# Patient Record
Sex: Male | Born: 1955 | Race: White | Hispanic: No | Marital: Married | Smoking: Never smoker
Health system: Southern US, Community
[De-identification: ages and names within clinical notes are randomized; demographics above are authoritative.]

## PROBLEM LIST (undated history)

## (undated) DIAGNOSIS — I10 Essential (primary) hypertension: Secondary | ICD-10-CM

## (undated) DIAGNOSIS — F419 Anxiety disorder, unspecified: Secondary | ICD-10-CM

## (undated) DIAGNOSIS — E119 Type 2 diabetes mellitus without complications: Secondary | ICD-10-CM

## (undated) DIAGNOSIS — R7303 Prediabetes: Secondary | ICD-10-CM

## (undated) HISTORY — PX: VASECTOMY: SHX75

---
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
## 2002-06-29 ENCOUNTER — Emergency Department (HOSPITAL_COMMUNITY): Admission: EM | Admit: 2002-06-29 | Discharge: 2002-06-29 | Payer: Self-pay | Admitting: Emergency Medicine

## 2002-06-29 ENCOUNTER — Encounter: Payer: Self-pay | Admitting: Emergency Medicine

## 2004-10-31 ENCOUNTER — Emergency Department (HOSPITAL_COMMUNITY): Admission: EM | Admit: 2004-10-31 | Discharge: 2004-10-31 | Payer: Self-pay | Admitting: Emergency Medicine

## 2006-08-17 ENCOUNTER — Emergency Department (HOSPITAL_COMMUNITY): Admission: EM | Admit: 2006-08-17 | Discharge: 2006-08-17 | Payer: Self-pay | Admitting: Emergency Medicine

## 2006-10-12 ENCOUNTER — Emergency Department (HOSPITAL_COMMUNITY): Admission: EM | Admit: 2006-10-12 | Discharge: 2006-10-12 | Payer: Self-pay | Admitting: Emergency Medicine

## 2007-02-26 ENCOUNTER — Emergency Department (HOSPITAL_COMMUNITY): Admission: EM | Admit: 2007-02-26 | Discharge: 2007-02-27 | Payer: Self-pay | Admitting: Emergency Medicine

## 2007-06-20 ENCOUNTER — Emergency Department (HOSPITAL_COMMUNITY): Admission: EM | Admit: 2007-06-20 | Discharge: 2007-06-20 | Payer: Self-pay | Admitting: Emergency Medicine

## 2007-07-17 ENCOUNTER — Emergency Department (HOSPITAL_COMMUNITY): Admission: EM | Admit: 2007-07-17 | Discharge: 2007-07-17 | Payer: Self-pay | Admitting: Emergency Medicine

## 2007-08-05 ENCOUNTER — Ambulatory Visit: Payer: Self-pay | Admitting: Cardiology

## 2007-08-12 ENCOUNTER — Ambulatory Visit: Payer: Self-pay | Admitting: Cardiology

## 2007-08-14 ENCOUNTER — Ambulatory Visit: Payer: Self-pay | Admitting: Cardiology

## 2007-09-29 ENCOUNTER — Emergency Department (HOSPITAL_COMMUNITY): Admission: EM | Admit: 2007-09-29 | Discharge: 2007-09-29 | Payer: Self-pay | Admitting: Emergency Medicine

## 2007-12-13 ENCOUNTER — Emergency Department (HOSPITAL_COMMUNITY): Admission: EM | Admit: 2007-12-13 | Discharge: 2007-12-13 | Payer: Self-pay | Admitting: Emergency Medicine

## 2007-12-14 ENCOUNTER — Emergency Department (HOSPITAL_COMMUNITY): Admission: EM | Admit: 2007-12-14 | Discharge: 2007-12-14 | Payer: Self-pay | Admitting: Emergency Medicine

## 2009-05-27 ENCOUNTER — Encounter (INDEPENDENT_AMBULATORY_CARE_PROVIDER_SITE_OTHER): Payer: Self-pay | Admitting: *Deleted

## 2010-06-22 ENCOUNTER — Ambulatory Visit (HOSPITAL_COMMUNITY): Admission: RE | Admit: 2010-06-22 | Discharge: 2010-06-22 | Payer: Self-pay | Admitting: General Surgery

## 2011-09-07 LAB — POCT CARDIAC MARKERS
CKMB, poc: <1 — ABNORMAL LOW
CKMB, poc: <1 — ABNORMAL LOW
Myoglobin, poc: 106
Myoglobin, poc: 92.3
Operator id: 282201
Operator id: 285841
Troponin i, poc: <0.05
Troponin i, poc: <0.05

## 2011-09-10 LAB — BASIC METABOLIC PANEL
CO2: 25
Calcium: 8.6
Creatinine, Ser: 0.95
GFR calc Af Amer: >60
GFR calc non Af Amer: >60

## 2011-09-10 LAB — POCT CARDIAC MARKERS
CKMB, poc: 1.1
Myoglobin, poc: 151

## 2011-09-10 LAB — DIFFERENTIAL
Basophils Absolute: 0
Basophils Relative: 1
Lymphocytes Relative: 22
Monocytes Relative: 7
Neutro Abs: 3.9
Neutrophils Relative %: 68

## 2011-09-10 LAB — CBC
MCHC: 33.9
RBC: 5.45

## 2012-08-25 ENCOUNTER — Other Ambulatory Visit (HOSPITAL_COMMUNITY): Payer: Self-pay | Admitting: Family Medicine

## 2012-08-25 ENCOUNTER — Ambulatory Visit (HOSPITAL_COMMUNITY)
Admission: RE | Admit: 2012-08-25 | Discharge: 2012-08-25 | Disposition: A | Discharge status: Home / Self Care | Payer: BC Managed Care – PPO | Source: Ambulatory Visit | Attending: Family Medicine | Admitting: Family Medicine

## 2012-08-25 DIAGNOSIS — R079 Chest pain, unspecified: Secondary | ICD-10-CM | POA: Insufficient documentation

## 2012-09-12 ENCOUNTER — Encounter: Payer: Self-pay | Admitting: Internal Medicine

## 2012-09-12 ENCOUNTER — Ambulatory Visit (INDEPENDENT_AMBULATORY_CARE_PROVIDER_SITE_OTHER): Payer: BC Managed Care – PPO | Admitting: Internal Medicine

## 2012-09-12 VITALS — BP 152/90 | HR 67 | Ht 73.0 in | Wt 341.4 lb

## 2012-09-12 DIAGNOSIS — R079 Chest pain, unspecified: Secondary | ICD-10-CM

## 2012-09-12 NOTE — Patient Instructions (Signed)
Your physician recommends that you schedule a follow-up appointment in:  We will call you for follow up  Your physician has requested that you have an echocardiogram. Echocardiography is a painless test that uses sound waves to create images of your heart. It provides your doctor with information about the size and shape of your heart and how well your heart's chambers and valves are working. This procedure takes approximately one hour. There are no restrictions for this procedure.

## 2012-09-12 NOTE — Progress Notes (Signed)
HPI patinet seen by Dr. Renard Matter.   Had aches in L side of chest.  No pain or arm discomfort. A little lightheaded.  Spell of discomfort lasted about 15 min.  Occurred late Srpt or early October NO SOB or diaphoresis SInce he has seen Dr Renard Matter Patient active.  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    Has noted occasional SOB with exertion.  Infrequent.   Occasional diaphoresis but no associated SOB Allergies  Allergen Reactions  . Penicillins     Swelling / Edema     Current Outpatient Prescriptions  Medication Sig Dispense Refill  . amLODipine-benazepril (LOTREL) 5-20 MG per capsule Take 1 capsule by mouth daily.       . metoprolol (LOPRESSOR) 50 MG tablet Take 50 mg by mouth daily.         No past medical history on file.  No past surgical history on file.  No family history on file.  History   Social History  . Marital Status: Divorced    Spouse Name: N/A    Number of Children: N/A  . Years of Education: N/A   Occupational History  . Not on file.   Social History Main Topics  . Smoking status: Never Smoker   . Smokeless tobacco: Not on file  . Alcohol Use: No  . Drug Use: No  . Sexually Active: Not on file   Other Topics Concern  . Not on file   Social History Narrative  . No narrative on file    Review of Systems:  All systems reviewed.  They are negative to the above problem except as previously stated.  Vital Signs: BP 152/90  Pulse 67  Ht 6\' 1"  (1.854 m)  Wt 341 lb 6.4 oz (154.858 kg)  BMI 45.04 kg/m2  SpO2 98%  Physical Exam Patient is a morbidly obese 56 year old in NAD HEENT:  Normocephalic, atraumatic. EOMI, PERRLA.  Neck: JVP is normal.  No bruits.  Lungs: clear to auscultation. No rales no wheezes.  Heart: Regular rate and rhythm. Normal S1, S2. No S3.   No significant murmurs. PMI not displaced.  Abdomen:  Supple, nontender. Normal bowel sounds. No masses. No hepatomegaly.  Extremities:   Good distal pulses throughout. No lower extremity  edema.  Musculoskeletal :moving all extremities.  Neuro:   alert and oriented x3.  CN II-XII grossly intact.  EKG (08/25/12)  SR.   Assessment and Plan:  1.  CP  I am not convinced his episode of CP at the end of September represents angina.  He has not had any since which would be very unusual given that he is active. CXR shows cardiomegaly.  Though not specific, I would recomm doing and echo to evaluate LV size, thickness.  He does have risks for CAD  I will see about having him undergo a screen (carotid/abdomenal) to evaluate.  He would like testing to be done on Friday's  2.  Morbid obesity.  Patient counselled on wt loss.  He admits to eating a lot of fast food He just gave up soft drinks  3.  HTN  A little high today  I would not switch meds  FOllow  Was ok on last clinic visit  Should get better with weight loss  4.  HCM  Will check on fasting lipids.

## 2012-09-15 ENCOUNTER — Encounter: Payer: Self-pay | Admitting: Internal Medicine

## 2012-09-17 ENCOUNTER — Telehealth: Payer: Self-pay | Admitting: *Deleted

## 2012-09-17 NOTE — Telephone Encounter (Deleted)
Spoke with patient wife yesterday to schedule PV screening per Dr.rossmessage was left for Mrs. Bruson to call me back to scheduled PV SCREENING for Donald Cardenas

## 2012-09-17 NOTE — Telephone Encounter (Signed)
Left message for Mr. Donald Cardenas to return my call. Per [name]/Dr. Tenny Craw, Schedule [name] for PV screening. Please let the patient know the out of pocket expense will be $150 at the time of check-in.

## 2012-09-19 ENCOUNTER — Telehealth: Payer: Self-pay | Admitting: *Deleted

## 2012-09-19 NOTE — Telephone Encounter (Signed)
Left message for patient to call and schedule PV screening per Dr.Ross

## 2012-09-26 ENCOUNTER — Ambulatory Visit (HOSPITAL_COMMUNITY)
Admission: RE | Admit: 2012-09-26 | Discharge: 2012-09-26 | Disposition: A | Discharge status: Home / Self Care | Payer: BC Managed Care – PPO | Source: Ambulatory Visit | Attending: Internal Medicine | Admitting: Internal Medicine

## 2012-09-26 DIAGNOSIS — R079 Chest pain, unspecified: Secondary | ICD-10-CM | POA: Insufficient documentation

## 2012-09-26 DIAGNOSIS — I10 Essential (primary) hypertension: Secondary | ICD-10-CM | POA: Insufficient documentation

## 2012-09-26 DIAGNOSIS — R072 Precordial pain: Secondary | ICD-10-CM

## 2012-09-26 NOTE — Progress Notes (Signed)
*  PRELIMINARY RESULTS* Echocardiogram 2D Echocardiogram has been performed.  Conrad  09/26/2012, 8:45 AM

## 2012-09-30 ENCOUNTER — Other Ambulatory Visit: Payer: Self-pay | Admitting: *Deleted

## 2013-06-27 DIAGNOSIS — Z841 Family history of disorders of kidney and ureter: Secondary | ICD-10-CM | POA: Insufficient documentation

## 2014-02-06 ENCOUNTER — Emergency Department (HOSPITAL_COMMUNITY): Payer: BC Managed Care – PPO

## 2014-02-06 ENCOUNTER — Encounter (HOSPITAL_COMMUNITY): Payer: Self-pay | Admitting: Emergency Medicine

## 2014-02-06 ENCOUNTER — Emergency Department (HOSPITAL_COMMUNITY)
Admission: EM | Admit: 2014-02-06 | Discharge: 2014-02-06 | Disposition: A | Discharge status: Home / Self Care | Payer: BC Managed Care – PPO | Attending: Emergency Medicine | Admitting: Emergency Medicine

## 2014-02-06 DIAGNOSIS — Z880 Allergy status to penicillin: Secondary | ICD-10-CM | POA: Insufficient documentation

## 2014-02-06 DIAGNOSIS — Z79899 Other long term (current) drug therapy: Secondary | ICD-10-CM | POA: Insufficient documentation

## 2014-02-06 DIAGNOSIS — Z862 Personal history of diseases of the blood and blood-forming organs and certain disorders involving the immune mechanism: Secondary | ICD-10-CM | POA: Insufficient documentation

## 2014-02-06 DIAGNOSIS — R61 Generalized hyperhidrosis: Secondary | ICD-10-CM | POA: Insufficient documentation

## 2014-02-06 DIAGNOSIS — M25569 Pain in unspecified knee: Secondary | ICD-10-CM | POA: Insufficient documentation

## 2014-02-06 DIAGNOSIS — Z8639 Personal history of other endocrine, nutritional and metabolic disease: Secondary | ICD-10-CM | POA: Insufficient documentation

## 2014-02-06 DIAGNOSIS — I861 Scrotal varices: Secondary | ICD-10-CM | POA: Insufficient documentation

## 2014-02-06 DIAGNOSIS — M549 Dorsalgia, unspecified: Secondary | ICD-10-CM | POA: Insufficient documentation

## 2014-02-06 DIAGNOSIS — I10 Essential (primary) hypertension: Secondary | ICD-10-CM | POA: Insufficient documentation

## 2014-02-06 DIAGNOSIS — M25561 Pain in right knee: Secondary | ICD-10-CM

## 2014-02-06 HISTORY — DX: Prediabetes: R73.03

## 2014-02-06 HISTORY — DX: Essential (primary) hypertension: I10

## 2014-02-06 LAB — CBC WITH DIFFERENTIAL/PLATELET
Basophils Absolute: 0 10*3/uL (ref 0.0–0.1)
Basophils Relative: 1 % (ref 0–1)
EOS PCT: 2 % (ref 0–5)
Eosinophils Absolute: 0.1 10*3/uL (ref 0.0–0.7)
HEMATOCRIT: 41.7 % (ref 39.0–52.0)
HEMOGLOBIN: 14 g/dL (ref 13.0–17.0)
LYMPHS ABS: 1.3 10*3/uL (ref 0.7–4.0)
LYMPHS PCT: 35 % (ref 12–46)
MCH: 26.7 pg (ref 26.0–34.0)
MCHC: 33.6 g/dL (ref 30.0–36.0)
MCV: 79.4 fL (ref 78.0–100.0)
MONO ABS: 0.4 10*3/uL (ref 0.1–1.0)
MONOS PCT: 11 % (ref 3–12)
NEUTROS ABS: 2 10*3/uL (ref 1.7–7.7)
Neutrophils Relative %: 52 % (ref 43–77)
Platelets: 138 10*3/uL — ABNORMAL LOW (ref 150–400)
RBC: 5.25 MIL/uL (ref 4.22–5.81)
RDW: 13.8 % (ref 11.5–15.5)
WBC: 3.8 10*3/uL — AB (ref 4.0–10.5)

## 2014-02-06 LAB — COMPREHENSIVE METABOLIC PANEL
ALT: 18 U/L (ref 0–53)
AST: 13 U/L (ref 0–37)
Albumin: 3.4 g/dL — ABNORMAL LOW (ref 3.5–5.2)
Alkaline Phosphatase: 80 U/L (ref 39–117)
BILIRUBIN TOTAL: 0.9 mg/dL (ref 0.3–1.2)
BUN: 12 mg/dL (ref 6–23)
CALCIUM: 8.6 mg/dL (ref 8.4–10.5)
CHLORIDE: 104 meq/L (ref 96–112)
CO2: 30 meq/L (ref 19–32)
CREATININE: 1.11 mg/dL (ref 0.50–1.35)
GFR, EST AFRICAN AMERICAN: 83 mL/min — AB (ref >90)
GFR, EST NON AFRICAN AMERICAN: 72 mL/min — AB (ref >90)
GLUCOSE: 177 mg/dL — AB (ref 70–99)
Potassium: 3.6 mEq/L — ABNORMAL LOW (ref 3.7–5.3)
Sodium: 143 mEq/L (ref 137–147)
Total Protein: 6.8 g/dL (ref 6.0–8.3)

## 2014-02-06 MED ORDER — IOHEXOL 300 MG/ML  SOLN
50.0000 mL | Freq: Once | INTRAMUSCULAR | Status: AC | PRN
  Administered 2014-02-06: 50 mL via ORAL

## 2014-02-06 MED ORDER — NAPROXEN 500 MG PO TABS: 500.0000 mg | ORAL_TABLET | Freq: Two times a day (BID) | ORAL | Status: DC

## 2014-02-06 MED ORDER — SODIUM CHLORIDE 0.9 % IV SOLN: INTRAVENOUS | Status: DC

## 2014-02-06 MED ORDER — MORPHINE SULFATE 4 MG/ML IJ SOLN
4.0000 mg | Freq: Once | INTRAMUSCULAR | Status: AC
  Administered 2014-02-06: 4 mg via INTRAMUSCULAR
  Filled 2014-02-06: qty 1

## 2014-02-06 MED ORDER — IOHEXOL 300 MG/ML  SOLN
100.0000 mL | Freq: Once | INTRAMUSCULAR | Status: AC | PRN
  Administered 2014-02-06: 100 mL via INTRAVENOUS

## 2014-02-06 MED ORDER — HYDROCODONE-ACETAMINOPHEN 5-325 MG PO TABS: ORAL_TABLET | ORAL | Status: DC

## 2014-02-06 MED ORDER — ONDANSETRON 4 MG PO TBDP
4.0000 mg | ORAL_TABLET | Freq: Once | ORAL | Status: AC
  Administered 2014-02-06: 4 mg via ORAL
  Filled 2014-02-06: qty 1

## 2014-02-06 MED ORDER — DOXYCYCLINE HYCLATE 100 MG PO CAPS: 100.0000 mg | ORAL_CAPSULE | Freq: Two times a day (BID) | ORAL | Status: DC

## 2014-02-06 NOTE — ED Provider Notes (Signed)
CSN: [id number]     Arrival date & time 02/06/14  1031 History  This chart was scribed for non-physician practitioner working with Ward Givens, MD by Ashley Jacobs, ED scribe. This patient was seen in room APA17/APA17 and the patient's care was started at 10:50 AM.  First MD Initiated Contact with Patient 02/06/14 1045     Chief Complaint  Patient presents with  . Testicle Pain     (Consider location/radiation/quality/duration/timing/severity/associated sxs/prior Treatment) Patient is a 58 y.o. male presenting with testicular pain. The history is provided by the patient and medical records. No language interpreter was used.  Testicle Pain This is a new problem. The current episode started yesterday. The problem occurs constantly. The problem has not changed since onset.The symptoms are aggravated by bending and exertion. Nothing relieves the symptoms. He has tried nothing for the symptoms.   HPI Comments: Donald Cardenas is a 58 y.o. male who presents to the Emergency Department complaining of constant, moderate right testicle pain that awakened him yesterday morning.  His right testicle is presents with swelling and hardness. He experiences pain with sitting (4/10 in severity ) and is worse when he walks. Denies difficulty urinating, dysuria, and hematuria. Pt had an episode of diaphoresis yesterday. Denies hx of similar symptoms. He has never had this before. He denies any known trauma.     Pt also complains of constant, moderate, medial right knee pain with swelling for the past few months. Denies injury to his knee. He was tested for gout this week by blood work and the result was negative. Denies left knee pain.  Pt also complains of constant, mild, non radiatinglower lumbar pain that is central in location, not on the sides. The pain is worse with weight bearing, bending his knee and getting out of chairs. The back pain and knee pain occurred about the same time.   Denies smoking and  drinking. Pt is currently taking Lopressor and Lotrel.   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   PCP Billee Cashing PA at [facility]  Past Medical History  Diagnosis Date  . Hypertension   . Borderline diabetes    Past Surgical History  Procedure Laterality Date  . Vasectomy     No family history on file. History  Substance Use Topics  . Smoking status: Never Smoker   . Smokeless tobacco: Not on file  . Alcohol Use: No   employed in IT  Review of Systems  Constitutional: Positive for diaphoresis.  Genitourinary: Positive for testicular pain. Negative for dysuria, hematuria and difficulty urinating.  Musculoskeletal: Positive for arthralgias, back pain, gait problem and joint swelling.  All other systems reviewed and are negative.      Allergies  Penicillins  Home Medications   Current Outpatient Rx  Name  Route  Sig  Dispense  Refill  . amLODipine-benazepril (LOTREL) 5-20 MG per capsule   Oral   Take 1 capsule by mouth daily.          . metoprolol (LOPRESSOR) 50 MG tablet   Oral   Take 50 mg by mouth daily.           BP 143/65  Pulse 90  Temp(Src) 99.1 F (37.3 C)  Resp 18  Ht 5' 11.5" (1.816 m)  Wt 300 lb (136.079 kg)  BMI 41.26 kg/m2  SpO2 98%  Vital signs normal     Physical Exam  Nursing note  and vitals reviewed. Constitutional: He is oriented to person, place, and time. He appears well-developed and well-nourished.  Non-toxic appearance. He does not appear ill. No distress.  HENT:  Head: Normocephalic and atraumatic.  Right Ear: External ear normal.  Left Ear: External ear normal.  Nose: Nose normal. No mucosal edema or rhinorrhea.  Mouth/Throat: Mucous membranes are normal. No dental abscesses or uvula swelling.  Eyes: Conjunctivae and EOM are normal. Pupils are equal, round, and reactive to light.  Neck: Normal range of motion and full passive range of motion without pain. Neck  supple.  Pulmonary/Chest: Effort normal. No respiratory distress. He has no rhonchi. He exhibits no crepitus.  Abdominal: Soft. Normal appearance and bowel sounds are normal. He exhibits no distension. There is no tenderness. There is no rebound and no guarding.  Genitourinary:  Right testicle feels enlarged and firm to palpation Tenderness along the right epididymis Left testicle is nontender without masses   Musculoskeletal: Normal range of motion. He exhibits no edema and no tenderness.  No erythema or edema of the right knee Tenderness around the medial joint space No obvious effusion. Pain medially with external rotation.   Neurological: He is alert and oriented to person, place, and time. He has normal strength. No cranial nerve deficit.  Skin: Skin is warm, dry and intact. No rash noted. No erythema. No pallor.  Psychiatric: He has a normal mood and affect. His speech is normal and behavior is normal. His mood appears not anxious.    ED Course  Procedures (including critical care time)  Medications  0.9 %  sodium chloride infusion (not administered)  morphine 4 MG/ML injection 4 mg (4 mg Intramuscular Given 02/06/14 1106)  ondansetron (ZOFRAN-ODT) disintegrating tablet 4 mg (4 mg Oral Given 02/06/14 1106)  iohexol (OMNIPAQUE) 300 MG/ML solution 50 mL (50 mLs Oral Contrast Given 02/06/14 1353)  iohexol (OMNIPAQUE) 300 MG/ML solution 100 mL (100 mLs Intravenous Contrast Given 02/06/14 1452)      DIAGNOSTIC STUDIES: Oxygen Saturation is 98% on room air, normal by my interpretation.    COORDINATION OF CARE:  10:57 AM Discussed course of care with pt . Pt understands and agrees.  13:00 discussed results of his [place], agreeable to have CT of abd/pelvis. IV started and labs ordered.   15:45 Pt given results of his CT scan and need for f/u with urologist. He was placed in a knee immobilizer and discussed orthopedic referral. [facility]  going to have his PCP due to his referrals. He will also be given local referrals just in case.  I reviewed treatment of thrombosed varicocele, there is not much information except that conservative treatment is recommended and rarely surgery is needed.  Labs Review Results for orders placed during the hospital encounter of 02/06/14  CBC WITH DIFFERENTIAL      Result Value Ref Range   WBC 3.8 (*) 4.0 - 10.5 K/uL   RBC 5.25  4.22 - 5.81 MIL/uL   Hemoglobin 14.0  13.0 - 17.0 g/dL   HCT 09.8  11.9 - 14.7 %   MCV 79.4  78.0 - 100.0 fL   MCH 26.7  26.0 - 34.0 pg   MCHC 33.6  30.0 - 36.0 g/dL   RDW 82.9  56.2 - 13.0 %   Platelets 138 (*) 150 - 400 K/uL   Neutrophils Relative % 52  43 - 77 %   Neutro Abs 2.0  1.7 - 7.7 K/uL  Lymphocytes Relative 35  12 - 46 %   Lymphs Abs 1.3  0.7 - 4.0 K/uL   Monocytes Relative 11  3 - 12 %   Monocytes Absolute 0.4  0.1 - 1.0 K/uL   Eosinophils Relative 2  0 - 5 %   Eosinophils Absolute 0.1  0.0 - 0.7 K/uL   Basophils Relative 1  0 - 1 %   Basophils Absolute 0.0  0.0 - 0.1 K/uL  COMPREHENSIVE METABOLIC PANEL      Result Value Ref Range   Sodium 143  137 - 147 mEq/L   Potassium 3.6 (*) 3.7 - 5.3 mEq/L   Chloride 104  96 - 112 mEq/L   CO2 30  19 - 32 mEq/L   Glucose, Bld 177 (*) 70 - 99 mg/dL   BUN 12  6 - 23 mg/dL   Creatinine, Ser 1.611.11  0.50 - 1.35 mg/dL   Calcium 8.6  8.4 - 09.610.5 mg/dL   Total Protein 6.8  6.0 - 8.3 g/dL   Albumin 3.4 (*) 3.5 - 5.2 g/dL   AST 13  0 - 37 U/L   ALT 18  0 - 53 U/L   Alkaline Phosphatase 80  39 - 117 U/L   Total Bilirubin 0.9  0.3 - 1.2 mg/dL   GFR calc non Af Amer 72 (*) >90 mL/min   GFR calc Af Amer 83 (*) >90 mL/min   Laboratory interpretation all normal except low total white blood cell count without neutropenia, mild hypokalemia   Imaging Review [place] Scrotum  02/06/2014   CLINICAL DATA:  Testicle pain  EXAM: SCROTAL ULTRASOUND  DOPPLER ULTRASOUND OF THE TESTICLES  TECHNIQUE: Complete ultrasound  examination of the testicles, epididymis, and other scrotal structures was performed. Color and spectral Doppler ultrasound were also utilized to evaluate blood flow to the testicles.  COMPARISON:  None.  FINDINGS: Right testicle  Measurements: 4.6 x 2.6 x 3.8 cm. No mass or microlithiasis visualized.  Left testicle  Measurements: 4.5 x 2.4 x 3.4 cm. No mass or microlithiasis visualized.  Right epididymis: Circumscribed simple cysts in the epididymal head consistent with spermatoceles versus epididymal cysts. The largest measures 5 mm in diameter.  Left epididymis:  Normal in size and appearance.  Hydrocele:  Small bilateral hydroceles.  Varicocele: Bilateral serpentine relatively hypoechoic tubular structures noted along the course of the bilateral spermatic cords. The sonographic findings are most consistent with bilateral varicoceles, however many of the tubular structures demonstrate internal echogenic material in do not demonstrate color flow concerning for possible partial bilateral varicocele thrombosis.  Pulsed Doppler interrogation of both testes demonstrates low resistance arterial and venous waveforms bilaterally.  IMPRESSION: Sonographic findings are concerning for bilateral partially thrombosed varicoceles. This could be secondary to slow flow from incompetent/ insufficient gonadal veins. However, external compression of the gonadal veins resulting in slow flow is also a possibility. Recommend further evaluation with CT scan of the abdomen and pelvis with contrast to evaluate for retroperitoneal mass or fibrosis.  Incidentally noted right epididymal head cysts versus spermatoceles.  The testicles are unremarkable.   Electronically Signed   By: Malachy MoanHeath  McCullough M.D.   On: 02/06/2014 12:59   Ct Abdomen Pelvis W Contrast  02/06/2014   CLINICAL DATA:  Testicular pain. Ultrasound demonstrating varices. Evaluate for retroperitoneal mass or fibrosis.  EXAM: CT ABDOMEN AND PELVIS WITH CONTRAST  TECHNIQUE:  Multidetector CT imaging of the abdomen and pelvis was performed using the standard protocol following bolus administration of intravenous contrast.  CONTRAST:  50mL OMNIPAQUE IOHEXOL 300 MG/ML SOLN, OMNIPAQUE IOHEXOL 300 MG/ML SOLN  COMPARISON:  US SCROTUM dated 02/06/2014  FINDINGS: Lower Chest: Clear lung bases. Normal heart size without pericardial or pleural effusion.  Abdomen/Pelvis: Minimal exclusion of the hepatic dome. Normal liver, spleen, stomach, pancreas, or adrenal glands. Suspect gallstones without acute cholecystitis or biliary ductal dilatation. Normal kidneys. No retroperitoneal or retrocrural adenopathy. Colonic stool burden suggests constipation. Normal terminal ileum. Appendix is not visualized but there is no evidence of right lower quadrant inflammation. Normal small bowel without abdominal ascites. A fat containing right inguinal hernia. No pelvic adenopathy.  Normal urinary bladder and prostate.  No significant free fluid.  Bones/Musculoskeletal: Favor remote trauma versus an exostosis in the right ischium. Left iliac bone island.  IMPRESSION: 1. No acute process in the abdomen or pelvis. No evidence of retroperitoneal mass or adenopathy. 2. Probable cholelithiasis. 3.  Possible constipation. 4. Fat containing right inguinal hernia.   Electronically Signed   By: Jeronimo Greaves M.D.   On: 02/06/2014 15:13   [place] Art/ven Flow Abd Pelv Doppler Limited  02/06/2014   CLINICAL DATA:  Testicle pain  EXAM: SCROTAL ULTRASOUND  DOPPLER ULTRASOUND OF THE TESTICLES  TECHNIQUE: Complete ultrasound examination of the testicles, epididymis, and other scrotal structures was performed. Color and spectral Doppler ultrasound were also utilized to evaluate blood flow to the testicles.  COMPARISON:  None.  FINDINGS: Right testicle  Measurements: 4.6 x 2.6 x 3.8 cm. No mass or microlithiasis visualized.  Left testicle  Measurements: 4.5 x 2.4 x 3.4 cm. No mass or microlithiasis visualized.  Right epididymis:  Circumscribed simple cysts in the epididymal head consistent with spermatoceles versus epididymal cysts. The largest measures 5 mm in diameter.  Left epididymis:  Normal in size and appearance.  Hydrocele:  Small bilateral hydroceles.  Varicocele: Bilateral serpentine relatively hypoechoic tubular structures noted along the course of the bilateral spermatic cords. The sonographic findings are most consistent with bilateral varicoceles, however many of the tubular structures demonstrate internal echogenic material in do not demonstrate color flow concerning for possible partial bilateral varicocele thrombosis.  Pulsed Doppler interrogation of both testes demonstrates low resistance arterial and venous waveforms bilaterally.  IMPRESSION: Sonographic findings are concerning for bilateral partially thrombosed varicoceles. This could be secondary to slow flow from incompetent/ insufficient gonadal veins. However, external compression of the gonadal veins resulting in slow flow is also a possibility. Recommend further evaluation with CT scan of the abdomen and pelvis with contrast to evaluate for retroperitoneal mass or fibrosis.  Incidentally noted right epididymal head cysts versus spermatoceles.  The testicles are unremarkable.   Electronically Signed   By: Malachy Moan M.D.   On: 02/06/2014 12:59   Dg Knee Complete 4 Views Right  02/06/2014   CLINICAL DATA:  Right knee pain  EXAM: RIGHT KNEE - COMPLETE 4+ VIEW  COMPARISON:  None.  FINDINGS: Joint space narrowing is noted medially. No acute fracture or dislocation is noted. Suprapatellar spurring is noted.  IMPRESSION: Degenerative change without acute abnormality.   Electronically Signed   By: Alcide Clever M.D.   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 CT scan was done to rule out intra-abdominal obstruction of  venous flow and was negative. Patient will be referred to urology. Conservative treatment will be started. Patient also has pain in  his medial aspect of these knee corresponding with loss of joint space on the medial aspect of his knee on x-ray. I suspect patient has arthritis and possible medial meniscal problem. He is placed in a knee immobilizer and will be referred to orthopedics.    Final diagnoses:  Bilateral varicoceles  Knee pain, right   New Prescriptions   DOXYCYCLINE (VIBRAMYCIN) 100 MG CAPSULE    Take 1 capsule (100 mg total) by mouth 2 (two) times daily.   HYDROCODONE-ACETAMINOPHEN (NORCO/VICODIN) 5-325 MG PER TABLET    Take 1 or 2 po Q 6hrs for pain   NAPROXEN (NAPROSYN) 500 MG TABLET    Take 1 tablet (500 mg total) by mouth 2 (two) times daily.    Plan discharge   Devoria Albe, MD, FACEP   I personally performed the services described in this documentation, which was scribed in my presence. The recorded information has been reviewed and considered.  Devoria Albe, MD, FACEP      Ward Givens, MD 02/06/14 [phone]

## 2014-02-06 NOTE — Discharge Instructions (Signed)
Use ice and heat to your scrotum and knee for pain. Wear a jockey support/athletic supporter for comfort. Take the antibiotics until gone. Take both of the pain medications. Call your primary care office on Monday, March 16th to get a referral to orthopedics and a urologist. Wear the knee immobilizer to support your knee. Return to the ED if you get worse such as fever, uncontrolled vomiting or worsening pain.   You have a right inguinal hernia that contains fat. If you get severe pain in the crease of your right thigh, vomiting. You also have gallstones seen on your CT scan, if you get pain in your right upper abdomen, that could be from the gallstones.

## 2014-02-06 NOTE — ED Notes (Signed)
Pt states right testicle is swollen, hard, and painful. Symptoms began yesterday morning. Right knee pain also which he has seen PMD for last week and given percocet, now out of percocet. Pt will see PMD again on the 23rd.

## 2015-01-03 DIAGNOSIS — G4730 Sleep apnea, unspecified: Secondary | ICD-10-CM | POA: Insufficient documentation

## 2015-09-19 ENCOUNTER — Emergency Department (HOSPITAL_BASED_OUTPATIENT_CLINIC_OR_DEPARTMENT_OTHER)
Admission: EM | Admit: 2015-09-19 | Discharge: 2015-09-19 | Disposition: A | Discharge status: Home / Self Care | Payer: BLUE CROSS/BLUE SHIELD | Attending: Emergency Medicine | Admitting: Emergency Medicine

## 2015-09-19 DIAGNOSIS — R220 Localized swelling, mass and lump, head: Secondary | ICD-10-CM | POA: Insufficient documentation

## 2015-09-19 DIAGNOSIS — Z79899 Other long term (current) drug therapy: Secondary | ICD-10-CM | POA: Insufficient documentation

## 2015-09-19 DIAGNOSIS — Z791 Long term (current) use of non-steroidal anti-inflammatories (NSAID): Secondary | ICD-10-CM | POA: Insufficient documentation

## 2015-09-19 DIAGNOSIS — Z880 Allergy status to penicillin: Secondary | ICD-10-CM | POA: Diagnosis not present

## 2015-09-19 DIAGNOSIS — I10 Essential (primary) hypertension: Secondary | ICD-10-CM | POA: Insufficient documentation

## 2015-09-19 MED ORDER — PREDNISONE 20 MG PO TABS: 60.0000 mg | ORAL_TABLET | Freq: Every day | ORAL | Status: DC

## 2015-09-19 MED ORDER — FAMOTIDINE 20 MG PO TABS
20.0000 mg | ORAL_TABLET | Freq: Once | ORAL | Status: AC
  Administered 2015-09-19: 20 mg via ORAL
  Filled 2015-09-19: qty 1

## 2015-09-19 MED ORDER — PREDNISONE 50 MG PO TABS
60.0000 mg | ORAL_TABLET | Freq: Once | ORAL | Status: AC
  Administered 2015-09-19: 60 mg via ORAL
  Filled 2015-09-19 (×2): qty 1

## 2015-09-19 NOTE — Discharge Instructions (Signed)
Angioedema  You were seen today for your facial swelling.  This may have been an allergic reaction but it is unclear what this could have been secondary to.  Take The prednisone as prescribed and take benadryl 2-3 times tomorrow to help control the symptoms.  Follow up with your primary care physician for further work up.  Return immediately with throat or mouth swelling or difficulty breathing.    Angioedema is a sudden swelling of tissues, often of the skin. It can occur on the face or genitals or in the abdomen or other body parts. The swelling usually develops over a short period and gets better in 24 to 48 hours. It often begins during the night and is found when the person wakes up. The person may also get red, itchy patches of skin (hives). Angioedema can be dangerous if it involves swelling of the air passages.  Depending on the cause, episodes of angioedema may only happen once, come back in unpredictable patterns, or repeat for several years and then gradually fade away.  CAUSES  Angioedema can be caused by an allergic reaction to various triggers. It can also result from nonallergic causes, including reactions to drugs, immune system disorders, viral infections, or an abnormal gene that is passed to you from your parents (hereditary). For some people with angioedema, the cause is unknown.  Some things that can trigger angioedema include:   Foods.   Medicines, such as ACE inhibitors, ARBs, nonsteroidal anti-inflammatory agents, or estrogen.   Latex.   Animal saliva.   Insect stings.   Dyes used in X-rays.   Mild injury.   Dental work.  Surgery.  Stress.   Sudden changes in temperature.   Exercise. SIGNS AND SYMPTOMS   Swelling of the skin.  Hives. If these are present, there is also intense itching.  Redness in the affected area.   Pain in the affected area.  Swollen lips or tongue.  Breathing problems. This may happen if the air passages  swell.  Wheezing. If internal organs are involved, there may be:   Nausea.   Abdominal pain.   Vomiting.   Difficulty swallowing.   Difficulty passing urine. DIAGNOSIS   Your health care provider will examine the affected area and take a medical and family history.  Various tests may be done to help determine the cause. Tests may include:  Allergy skin tests to see if the problem is an allergic reaction.   Blood tests to check for hereditary angioedema.   Tests to check for underlying diseases that could cause the condition.   A review of your medicines, including over-the-counter medicines, may be done. TREATMENT  Treatment will depend on the cause of the angioedema. Possible treatments include:   Removal of anything that triggered the condition (such as stopping certain medicines).   Medicines to treat symptoms or prevent attacks. Medicines given may include:   Antihistamines.   Epinephrine injection.   Steroids.   Hospitalization may be required for severe attacks. If the air passages are affected, it can be an emergency. Tubes may need to be placed to keep the airway open. HOME CARE INSTRUCTIONS   Take all medicines as directed by your health care provider.  If you were given medicines for emergency allergy treatment, always carry them with you.  Wear a medical bracelet as directed by your health care provider.   Avoid known triggers. SEEK MEDICAL CARE IF:   You have repeat attacks of angioedema.   Your attacks are more  frequent or more severe despite preventive measures.   You have hereditary angioedema and are considering having children. It is important to discuss with your health care provider the risks of passing the condition on to your children. SEEK IMMEDIATE MEDICAL CARE IF:   You have severe swelling of the mouth, tongue, or lips.  You have difficulty breathing.   You have difficulty swallowing.   You faint. MAKE SURE  YOU:  Understand these instructions.  Will watch your condition.  Will get help right away if you are not doing well or get worse.   This information is not intended to replace advice given to you by your health care provider. Make sure you discuss any questions you have with your health care provider.   Document Released: 01/21/2002 Document Revised: 12/03/2014 Document Reviewed: 07/06/2013 Elsevier Interactive Patient Education [employer]2016 [employer]

## 2015-09-19 NOTE — ED Notes (Signed)
Per Pt. He feels like he has started to get burning eyes and and swelling on the L side of his face today.  Pt. Also has felt like he has a scratchy throat and sinus drainage since last week.  Pt. Reports he took 2 benadryl today and is now feeling better.  Pt. Has equal smile and able to stick tongue out.  No drooling noted .  Pt. Speaks much and clearly.

## 2015-09-19 NOTE — ED Notes (Signed)
Pt. Wants to get something for his sinus infection while here please.

## 2015-09-19 NOTE — ED Notes (Signed)
Sinus drainage and scratchy throat x 1 week, feels like eyes are itching and facial swelling x 1 day

## 2015-09-19 NOTE — ED Notes (Signed)
Nurse first rounds: pt is feeling better after taking two benadryl. Swelling in face has gone down, eyes still puffy. VSS. SAts 98% RA, BP 130/74

## 2015-09-19 NOTE — ED Provider Notes (Addendum)
CSN: [id number]     Arrival date & time 09/19/15  1729 History  By signing my name below, I, Budd PalmerVanessa Prueter, attest that this documentation has been prepared under the direction and in the presence of Leta BaptistEmily Roe Lamont Glasscock, MD. Electronically Signed: Budd PalmerVanessa Prueter, ED Scribe. 09/19/2015. 9:19 PM.    Chief Complaint  Patient presents with  . Allergic Reaction   The history is provided by the patient. No language interpreter was used.   HPI Comments: Donald Cardenas is a 59 y.o. male with a PMHx of HTN and borderline DM who presents to the Emergency Department complaining of an allergic reaction onset 7 hours ago while at work. He reports associated irritation of the eyes (itching, watery discharge, and swelling of the eyelids), facial swelling all the way down to his neck (resolved). He notes he had some leftovers for lunch that had been refrigerated all day. Pt states he took 2 benadryl 30 minutes PTA and feels much better now. He notes he did feel very sleepy and has been sleeping in the lobby from this. He also states he has a sinus infection from which he has had a large amount of nasal drainage and states his throat has been raw as well. He notes a similar previous reaction after eating a [employer] chicken sandwich at the office. He states he is on 500 mg Metformin 2x daily for his DM. He notes he does not check his blood sugar. He has an appointment with his PCP on 11/3. He denies being on lisinopril or an ace-inhibitor. Pt denies SOB and n/v/d.  Pt is allergic to penicillins.   Past Medical History  Diagnosis Date  . Hypertension   . Borderline diabetes    Past Surgical History  Procedure Laterality Date  . Vasectomy     No family history on file. Social History  Substance Use Topics  . Smoking status: Never Smoker   . Smokeless tobacco: Not on file  . Alcohol Use: No    Review of Systems  HENT: Positive for congestion, facial swelling, postnasal drip and sore throat.   Eyes:  Positive for pain, discharge and itching.  Respiratory: Negative for shortness of breath.   Gastrointestinal: Negative for nausea, vomiting and diarrhea.    Allergies  Penicillins  Home Medications   Prior to Admission medications   Medication Sig Start Date End Date Taking? Authorizing Provider  hydrochlorothiazide (HYDRODIURIL) 25 MG tablet Take 25 mg by mouth daily.   Yes Historical Provider, MD  metFORMIN (GLUCOPHAGE) 500 MG tablet Take by mouth 2 (two) times daily with a meal.   Yes Historical Provider, MD  ALPRAZolam (XANAX) 0.5 MG tablet Take 0.5-1 tablets by mouth daily as needed for anxiety.  02/02/14   Historical Provider, MD  amLODipine-benazepril (LOTREL) 5-20 MG per capsule Take 1 capsule by mouth daily.  09/08/12   Historical Provider, MD  doxycycline (VIBRAMYCIN) 100 MG capsule Take 1 capsule (100 mg total) by mouth 2 (two) times daily. 02/06/14   Devoria AlbeIva Knapp, MD  HYDROcodone-acetaminophen (NORCO/VICODIN) 5-325 MG per tablet Take 1 or 2 po Q 6hrs for pain 02/06/14   Devoria AlbeIva Knapp, MD  metoprolol (LOPRESSOR) 50 MG tablet Take 50 mg by mouth daily.  07/09/12   Historical Provider, MD  naproxen (NAPROSYN) 500 MG tablet Take 1 tablet (500 mg total) by mouth 2 (two) times daily. 02/06/14   Devoria AlbeIva Knapp, MD   BP 130/74 mmHg  Pulse 67  Temp(Src) 98.4 F (36.9 C) (Oral)  Resp  20  Ht 6' (1.829 m)  Wt 300 lb (136.079 kg)  BMI 40.68 kg/m2  SpO2 98% Physical Exam  Constitutional: He is oriented to person, place, and time. He appears well-developed and well-nourished. No distress.  HENT:  Head: Normocephalic and atraumatic.  Right Ear: External ear normal.  Left Ear: External ear normal.  Mouth/Throat: Oropharynx is clear and moist. No oropharyngeal exudate, posterior oropharyngeal edema or posterior oropharyngeal erythema.  Post nasal drip noted over the posterior pharynx  Eyes: EOM are normal. Pupils are equal, round, and reactive to light.  Neck: Normal range of motion. Neck supple.   Cardiovascular: Normal rate, regular rhythm, normal heart sounds and intact distal pulses.   No murmur heard. Pulmonary/Chest: Effort normal. No respiratory distress. He has no wheezes. He has no rales.  Abdominal: Soft. He exhibits no distension. There is no tenderness.  Musculoskeletal: He exhibits no edema.  Neurological: He is alert and oriented to person, place, and time. He has normal strength. No cranial nerve deficit or sensory deficit.  Skin: Skin is warm and dry. No rash noted. He is not diaphoretic.  Mild swelling of the eyelids and left face noted  Vitals reviewed.   ED Course  Procedures  DIAGNOSTIC STUDIES: Oxygen Saturation is 98% on RA, normal by my interpretation.    COORDINATION OF CARE: 9:17 PM - Discussed plans to order steroids. Advised pt to take benadryl 2-3x tomorrow and rest at home. Advised to f/u with PCP for a referral to an allergist. Pt advised of plan for treatment and pt agrees.  Labs Review Labs Reviewed - No data to display  Imaging Review No results found. I have personally reviewed and evaluated these images and lab results as part of my medical decision-making.   EKG Interpretation None      MDM  Patient seen and evaluated in stable condition.  Signs of mild edema/angioedema of the face without oral involvement.  Patient given Prednisone and pepcid for symptom control.  Patient not on ACE-inhibitor.  Patient discharged with prescriptions for prednisone and instruction to take benadryl and follow up with his PCP.  Strict return precautions given. Final diagnoses:  None    1. Angioedema of the cheek  I personally performed the services described in this documentation, which was scribed in my presence. The recorded information has been reviewed and is accurate.   Leta Baptist, MD 09/20/15 [phone]  On further chart review patient noted to be on Lotrel which is a combination CCB and ACE-i.  Patient called and discussed on the phone the  fact that the ACE-i could be what caused his facial swelling and that it could cause life threatening angioedema and that he should stop taking it immediately.  He reported feeling well and that he would stop the medication and follow up with his primary care physician.  Leta Baptist, MD 09/20/15 [phone]

## 2015-09-27 ENCOUNTER — Emergency Department (HOSPITAL_BASED_OUTPATIENT_CLINIC_OR_DEPARTMENT_OTHER)
Admission: EM | Admit: 2015-09-27 | Discharge: 2015-09-27 | Disposition: A | Discharge status: Home / Self Care | Payer: BLUE CROSS/BLUE SHIELD | Attending: Emergency Medicine | Admitting: Emergency Medicine

## 2015-09-27 ENCOUNTER — Encounter (HOSPITAL_BASED_OUTPATIENT_CLINIC_OR_DEPARTMENT_OTHER): Payer: Self-pay | Admitting: Emergency Medicine

## 2015-09-27 DIAGNOSIS — Z791 Long term (current) use of non-steroidal anti-inflammatories (NSAID): Secondary | ICD-10-CM | POA: Diagnosis not present

## 2015-09-27 DIAGNOSIS — Z79899 Other long term (current) drug therapy: Secondary | ICD-10-CM | POA: Insufficient documentation

## 2015-09-27 DIAGNOSIS — Z792 Long term (current) use of antibiotics: Secondary | ICD-10-CM | POA: Diagnosis not present

## 2015-09-27 DIAGNOSIS — Z7952 Long term (current) use of systemic steroids: Secondary | ICD-10-CM | POA: Diagnosis not present

## 2015-09-27 DIAGNOSIS — X58XXXD Exposure to other specified factors, subsequent encounter: Secondary | ICD-10-CM | POA: Insufficient documentation

## 2015-09-27 DIAGNOSIS — T783XXD Angioneurotic edema, subsequent encounter: Secondary | ICD-10-CM | POA: Insufficient documentation

## 2015-09-27 DIAGNOSIS — Z880 Allergy status to penicillin: Secondary | ICD-10-CM | POA: Insufficient documentation

## 2015-09-27 DIAGNOSIS — Z7984 Long term (current) use of oral hypoglycemic drugs: Secondary | ICD-10-CM | POA: Insufficient documentation

## 2015-09-27 DIAGNOSIS — I10 Essential (primary) hypertension: Secondary | ICD-10-CM | POA: Insufficient documentation

## 2015-09-27 MED ORDER — PREDNISONE 50 MG PO TABS
60.0000 mg | ORAL_TABLET | Freq: Once | ORAL | Status: AC
  Administered 2015-09-27: 60 mg via ORAL
  Filled 2015-09-27 (×2): qty 1

## 2015-09-27 MED ORDER — FAMOTIDINE 20 MG PO TABS
20.0000 mg | ORAL_TABLET | Freq: Once | ORAL | Status: AC
  Administered 2015-09-27: 20 mg via ORAL
  Filled 2015-09-27: qty 1

## 2015-09-27 NOTE — ED Provider Notes (Signed)
CSN: [id number]     Arrival date & time 09/27/15  0919 History   First MD Initiated Contact with Patient 09/27/15 1019     Chief Complaint  Patient presents with  . Allergic Reaction     (Consider location/radiation/quality/duration/timing/severity/associated sxs/prior Treatment) HPI Patient was seen on 10/24 for an allergic reaction to medication. He reports at that time he had more facial swelling and was told he had to stop his Lotrel. He was given a course of steroids which she finished in the last several days. He reports Sunday evening he did take 1 more Lotrel because he is concerned about his blood pressure. He states this morning at work he started to develop swelling on the side of his face and intense itching and sensation of swelling in his palms. He immediately took some Benadryl. He states the symptoms are improving again now. He at this time he denies any difficulty breathing or swallowing. He reports the symptoms have abated significantly. He does have a follow-up appointment with his doctor this evening to continue medication adjustments and workup for this event. Past Medical History  Diagnosis Date  . Hypertension   . Borderline diabetes    Past Surgical History  Procedure Laterality Date  . Vasectomy     No family history on file. Social History  Substance Use Topics  . Smoking status: Never Smoker   . Smokeless tobacco: None  . Alcohol Use: No    Review of Systems 10 Systems reviewed and are negative for acute change except as noted in the HPI.    Allergies  Benazepril and Penicillins  Home Medications   Prior to Admission medications   Medication Sig Start Date End Date Taking? Authorizing Provider  ALPRAZolam Prudy Feeler) 0.5 MG tablet Take 0.5-1 tablets by mouth daily as needed for anxiety.  02/02/14   Historical Provider, MD  amLODipine-benazepril (LOTREL) 5-20 MG per capsule Take 1 capsule by mouth daily.  09/08/12   Historical Provider, MD  doxycycline  (VIBRAMYCIN) 100 MG capsule Take 1 capsule (100 mg total) by mouth 2 (two) times daily. 02/06/14   Devoria Albe, MD  hydrochlorothiazide (HYDRODIURIL) 25 MG tablet Take 25 mg by mouth daily.    Historical Provider, MD  HYDROcodone-acetaminophen (NORCO/VICODIN) 5-325 MG per tablet Take 1 or 2 po Q 6hrs for pain 02/06/14   Devoria Albe, MD  metFORMIN (GLUCOPHAGE) 500 MG tablet Take by mouth 2 (two) times daily with a meal.    Historical Provider, MD  metoprolol (LOPRESSOR) 50 MG tablet Take 50 mg by mouth daily.  07/09/12   Historical Provider, MD  naproxen (NAPROSYN) 500 MG tablet Take 1 tablet (500 mg total) by mouth 2 (two) times daily. 02/06/14   Devoria Albe, MD  predniSONE (DELTASONE) 20 MG tablet Take 3 tablets (60 mg total) by mouth daily. 09/20/15   Leta Baptist, MD   BP 158/94 mmHg  Pulse 77  Temp(Src) 98.7 F (37.1 C) (Oral)  Resp 16  Ht 6' (1.829 m)  Wt 320 lb (145.151 kg)  BMI 43.39 kg/m2  SpO2 97% Physical Exam  Constitutional: He is oriented to person, place, and time.  Patient is morbidly obese. He is alert and nontoxic. He has no respiratory distress. Mental status is clear.  HENT:  Head: Normocephalic and atraumatic.  Right Ear: External ear normal.  Left Ear: External ear normal.  Nose: Nose normal.  Mouth/Throat: Oropharynx is clear and moist.  Eyes: EOM are normal. Pupils are equal, round, and reactive to  light.  Neck: Neck supple.  Cardiovascular: Normal rate, regular rhythm, normal heart sounds and intact distal pulses.   Pulmonary/Chest: Effort normal and breath sounds normal. No stridor.  Musculoskeletal: He exhibits no edema or tenderness.  Neurological: He is alert and oriented to person, place, and time. Coordination normal.  Skin: Skin is warm and dry.        ED Course  Procedures (including critical care time) Labs Review Labs Reviewed - No data to display  Imaging Review No results found. I have personally reviewed and evaluated these images and lab  results as part of my medical decision-making.   EKG Interpretation None      MDM   Final diagnoses:  Angioedema, subsequent encounter   Currently patient's symptoms have improved after having taken Benadryl prior to arrival. This still appears consistent with ACE inhibitor angioedema. She does not have urticarial type rash. He describes focal areas of swelling on his face. He has no airway impingement. At this time I feel he is safe to continue his outpatient management and he is educated on Ace inhibitors and angioedema. He has follow-up with his primary physician this evening to continue dressing ongoing medication and diagnostic workup if needed.    Arby BarretteMarcy Karel Mowers, MD 09/27/15 1037

## 2015-09-27 NOTE — Discharge Instructions (Signed)
Angioedema Do not take any medications that are in the category of Ace inhibitors or ARBs (these are types of blood pressure medications. Your Lotrel is a combination blood pressure medication that contains an ACE inhibitor.) Angioedema is a sudden swelling of tissues, often of the skin. It can occur on the face or genitals or in the abdomen or other body parts. The swelling usually develops over a short period and gets better in 24 to 48 hours. It often begins during the night and is found when the person wakes up. The person may also get red, itchy patches of skin (hives). Angioedema can be dangerous if it involves swelling of the air passages.  Depending on the cause, episodes of angioedema may only happen once, come back in unpredictable patterns, or repeat for several years and then gradually fade away.  CAUSES  Angioedema can be caused by an allergic reaction to various triggers. It can also result from nonallergic causes, including reactions to drugs, immune system disorders, viral infections, or an abnormal gene that is passed to you from your parents (hereditary). For some people with angioedema, the cause is unknown.  Some things that can trigger angioedema include:   Foods.   Medicines, such as ACE inhibitors, ARBs, nonsteroidal anti-inflammatory agents, or estrogen.   Latex.   Animal saliva.   Insect stings.   Dyes used in X-rays.   Mild injury.   Dental work.  Surgery.  Stress.   Sudden changes in temperature.   Exercise. SIGNS AND SYMPTOMS   Swelling of the skin.  Hives. If these are present, there is also intense itching.  Redness in the affected area.   Pain in the affected area.  Swollen lips or tongue.  Breathing problems. This may happen if the air passages swell.  Wheezing. If internal organs are involved, there may be:   Nausea.   Abdominal pain.   Vomiting.   Difficulty swallowing.   Difficulty passing urine. DIAGNOSIS    Your health care provider will examine the affected area and take a medical and family history.  Various tests may be done to help determine the cause. Tests may include:  Allergy skin tests to see if the problem is an allergic reaction.   Blood tests to check for hereditary angioedema.   Tests to check for underlying diseases that could cause the condition.   A review of your medicines, including over-the-counter medicines, may be done. TREATMENT  Treatment will depend on the cause of the angioedema. Possible treatments include:   Removal of anything that triggered the condition (such as stopping certain medicines).   Medicines to treat symptoms or prevent attacks. Medicines given may include:   Antihistamines.   Epinephrine injection.   Steroids.   Hospitalization may be required for severe attacks. If the air passages are affected, it can be an emergency. Tubes may need to be placed to keep the airway open. HOME CARE INSTRUCTIONS   Take all medicines as directed by your health care provider.  If you were given medicines for emergency allergy treatment, always carry them with you.  Wear a medical bracelet as directed by your health care provider.   Avoid known triggers. SEEK MEDICAL CARE IF:   You have repeat attacks of angioedema.   Your attacks are more frequent or more severe despite preventive measures.   You have hereditary angioedema and are considering having children. It is important to discuss with your health care provider the risks of passing the condition on to  your children. SEEK IMMEDIATE MEDICAL CARE IF:   You have severe swelling of the mouth, tongue, or lips.  You have difficulty breathing.   You have difficulty swallowing.   You faint. MAKE SURE YOU:  Understand these instructions.  Will watch your condition.  Will get help right away if you are not doing well or get worse.   This information is not intended to replace  advice given to you by your health care provider. Make sure you discuss any questions you have with your health care provider.   Document Released: 01/21/2002 Document Revised: 12/03/2014 Document Reviewed: 07/06/2013 Elsevier Interactive Patient Education [employer]2016 [employer] Inc.

## 2015-09-27 NOTE — ED Notes (Addendum)
Pt states he was seen on 10/24 for reaction to medication allergy.  Pt decided to take one pill two days ago.  Pt was concerned about his blood pressure.  Pt did not know his blood pressure.    Pt having some hand swelling, tightness in arms, some facial swelling.  No airway impairment.  Pt talking in sentences.  Pt took benadryl 25 mg po prior to arrival.

## 2019-05-15 ENCOUNTER — Ambulatory Visit (INDEPENDENT_AMBULATORY_CARE_PROVIDER_SITE_OTHER): Payer: BLUE CROSS/BLUE SHIELD | Admitting: Cardiology

## 2019-05-15 ENCOUNTER — Other Ambulatory Visit: Payer: Self-pay

## 2019-05-15 ENCOUNTER — Encounter: Payer: Self-pay | Admitting: Cardiology

## 2019-05-15 VITALS — BP 160/94 | HR 63 | Ht 71.0 in | Wt 304.0 lb

## 2019-05-15 DIAGNOSIS — F419 Anxiety disorder, unspecified: Secondary | ICD-10-CM | POA: Diagnosis not present

## 2019-05-15 DIAGNOSIS — I10 Essential (primary) hypertension: Secondary | ICD-10-CM | POA: Insufficient documentation

## 2019-05-15 DIAGNOSIS — I493 Ventricular premature depolarization: Secondary | ICD-10-CM | POA: Diagnosis not present

## 2019-05-15 DIAGNOSIS — E119 Type 2 diabetes mellitus without complications: Secondary | ICD-10-CM

## 2019-05-15 HISTORY — DX: Anxiety disorder, unspecified: F41.9

## 2019-05-15 HISTORY — DX: Type 2 diabetes mellitus without complications: E11.9

## 2019-05-15 HISTORY — DX: Essential (primary) hypertension: I10

## 2019-05-15 MED ORDER — METOPROLOL SUCCINATE ER 50 MG PO TB24: 50.0000 mg | ORAL_TABLET | Freq: Every day | ORAL | 2 refills | Status: DC

## 2019-05-15 NOTE — Progress Notes (Signed)
Patient referred by Aura Dials, PA-C for irregular heart rate  Subjective:   Donald Cardenas, male    DOB: 1956-09-02, 63 y.o.   MRN: [id number]   Chief Complaint  Patient presents with  . Abnormal ECG  . New Patient (Initial Visit)    HPI  63 y.o. Caucasian male with hypertension, type 2 DM, referred for evaluation of irregular heart rate.  Patient lives in [place], [place] and works as an Probation officer in [employer].  His job is stressful and has been busier during the pandemic period.  Prior to this, he used to exercise regularly, walking up to 4-10 miles on treadmill regularly without any symptoms of chest pain, shortness of breath.  He lost several pounds intentionally prior to March 2020, only to gain several pounds back during the pandemic.  He was seeing his PCP for routine visit, during which his EKG was noted to be abnormal with frequent PVCs.  Ever since going diagnosis of PVCs, he has experienced occasional episodes of palpitation lasting for 1-2 seconds, without any presyncope or syncope.  Blood pressure is elevated today.  He is compliant with his medical therapy.  He is compliant with CPAP use for his OSA.   Past Medical History:  Diagnosis Date  . Borderline diabetes   . Hypertension      Past Surgical History:  Procedure Laterality Date  . VASECTOMY       Social History   Socioeconomic History  . Marital status: Married    Spouse name: Not on file  . Number of children: Not on file  . Years of education: Not on file  . Highest education level: Not on file  Occupational History  . Not on file  Social Needs  . Financial resource strain: Not on file  . Food insecurity    Worry: Not on file    Inability: Not on file  . Transportation needs    Medical: Not on file    Non-medical: Not on file  Tobacco Use  . Smoking status: Never Smoker  Substance and Sexual Activity  . Alcohol use: No  . Drug use: No  . Sexual activity: Not on file  Lifestyle  .  Physical activity    Days per week: Not on file    Minutes per session: Not on file  . Stress: Not on file  Relationships  . Social Herbalist on phone: Not on file    Gets together: Not on file    Attends religious service: Not on file    Active member of club or organization: Not on file    Attends meetings of clubs or organizations: Not on file    Relationship status: Not on file  . Intimate partner violence    Fear of current or ex partner: Not on file    Emotionally abused: Not on file    Physically abused: Not on file    Forced sexual activity: Not on file  Other Topics Concern  . Not on file  Social History Narrative  . Not on file     History reviewed. No pertinent family history.   Current Outpatient Medications on File Prior to Visit  Medication Sig Dispense Refill  . ALPRAZolam (XANAX) 0.5 MG tablet Take 0.5-1 tablets by mouth daily as needed for anxiety.     Marland Kitchen amLODipine-benazepril (LOTREL) 5-20 MG per capsule Take 1 capsule by mouth daily.     Marland Kitchen doxycycline (VIBRAMYCIN) 100  MG capsule Take 1 capsule (100 mg total) by mouth 2 (two) times daily. 20 capsule 0  . hydrochlorothiazide (HYDRODIURIL) 25 MG tablet Take 25 mg by mouth daily.    Marland Kitchen HYDROcodone-acetaminophen (NORCO/VICODIN) 5-325 MG per tablet Take 1 or 2 po Q 6hrs for pain 20 tablet 0  . metFORMIN (GLUCOPHAGE) 500 MG tablet Take by mouth 2 (two) times daily with a meal.    . metoprolol (LOPRESSOR) 50 MG tablet Take 50 mg by mouth daily.     . naproxen (NAPROSYN) 500 MG tablet Take 1 tablet (500 mg total) by mouth 2 (two) times daily. 30 tablet 0  . predniSONE (DELTASONE) 20 MG tablet Take 3 tablets (60 mg total) by mouth daily. 9 tablet 0   No current facility-administered medications on file prior to visit.     Cardiovascular studies:  EKG 05/15/2019: Sinus rhythm 66 bpm. Nonspecific ST depression  -Nondiagnostic.  Two PVC's seen.  Echocardiogram 2013: - Left ventricle: The cavity size  was normal. There was mild  to moderate concentric hypertrophy. Systolic function was  normal. The estimated ejection fraction was in the range  of 60% to 65%.  - Aortic valve: Mildly calcified annulus.  - Mitral valve: Calcified annulus.  - Right ventricle: The cavity size was normal. Wall  thickness was mildly increased.  - Right atrium: The atrium was mildly dilated.   Recent labs: 05/06/2019: Glucose 91. BUN/Cr 18/1.16 eGFR 67. Na/K 144/4.6.  Chol 144, TG 57, HDL 41, LDL 92.  HbA1C 5.7%   Review of Systems  Constitution: Negative for decreased appetite, malaise/fatigue, weight gain and weight loss.  HENT: Negative for congestion.   Eyes: Negative for visual disturbance.  Cardiovascular: Positive for palpitations (Occasional, short lasting). Negative for chest pain, dyspnea on exertion, leg swelling and syncope.  Respiratory: Negative for cough.   Endocrine: Negative for cold intolerance.  Hematologic/Lymphatic: Does not bruise/bleed easily.  Skin: Negative for itching and rash.  Musculoskeletal: Negative for myalgias.  Gastrointestinal: Negative for abdominal pain, nausea and vomiting.  Genitourinary: Negative for dysuria.  Neurological: Negative for dizziness and weakness.  Psychiatric/Behavioral: The patient is not nervous/anxious.   All other systems reviewed and are negative.        Vitals:   05/15/19 1059  BP: (!) 160/94  Pulse: 63  SpO2: 96%     Body mass index is 42.4 kg/m. Filed Weights   05/15/19 1059  Weight: (!) 304 lb (137.9 kg)     Objective:   Physical Exam  Constitutional: He is oriented to person, place, and time. He appears well-developed and well-nourished. No distress.  HENT:  Head: Normocephalic and atraumatic.  Eyes: Pupils are equal, round, and reactive to light. Conjunctivae are normal.  Neck: No JVD present.  Cardiovascular: Normal rate, regular rhythm and intact distal pulses.  Pulmonary/Chest: Effort normal and breath  sounds normal. He has no wheezes. He has no rales.  Abdominal: Soft. Bowel sounds are normal. There is no rebound.  Musculoskeletal:        General: Edema (Trace b/l) present.  Lymphadenopathy:    He has no cervical adenopathy.  Neurological: He is alert and oriented to person, place, and time. No cranial nerve deficit.  Skin: Skin is warm and dry.  Psychiatric: He has a normal mood and affect.  Nursing note and vitals reviewed.         Assessment & Recommendations:   63 y.o. Caucasian male with hypertension, type 2 DM, with symptomatic PVCs.  Symptomatic PVCs:  Likely related to his underlying hypertension.  My suspicion for significant cardiomyopathy is low.  Will obtain echocardiogram to rule out structural abnormalities.  My suspicion for ischemia is low given absence of anginal symptoms with excellent exercise capacity at baseline.  Started metoprolol succinate 50 mg once daily.  This should also help with hypertension.   Hypertension: Continue current antihypertensive therapy, in addition to metoprolol succinate, as above.  Continue management of diabetes and OSA.   Thank you for referring the patient to [place]. Please feel free to contact with any questions.  Nigel Mormon, MD [facility] Pager: [phone] Office: [phone] If no answer Cell [phone]

## 2019-05-16 ENCOUNTER — Encounter: Payer: Self-pay | Admitting: Cardiology

## 2019-05-16 DIAGNOSIS — I493 Ventricular premature depolarization: Secondary | ICD-10-CM | POA: Insufficient documentation

## 2019-06-19 ENCOUNTER — Other Ambulatory Visit: Payer: Self-pay | Admitting: Cardiology

## 2019-06-19 DIAGNOSIS — I10 Essential (primary) hypertension: Secondary | ICD-10-CM

## 2019-06-25 ENCOUNTER — Telehealth: Payer: BC Managed Care – PPO | Admitting: Cardiology

## 2019-06-25 ENCOUNTER — Ambulatory Visit (INDEPENDENT_AMBULATORY_CARE_PROVIDER_SITE_OTHER): Payer: BLUE CROSS/BLUE SHIELD

## 2019-06-25 ENCOUNTER — Other Ambulatory Visit: Payer: Self-pay

## 2019-06-25 DIAGNOSIS — I493 Ventricular premature depolarization: Secondary | ICD-10-CM

## 2019-06-25 DIAGNOSIS — I10 Essential (primary) hypertension: Secondary | ICD-10-CM

## 2019-07-08 ENCOUNTER — Other Ambulatory Visit: Payer: Self-pay

## 2019-07-08 ENCOUNTER — Telehealth: Payer: BLUE CROSS/BLUE SHIELD | Admitting: Cardiology

## 2019-07-08 ENCOUNTER — Encounter: Payer: Self-pay | Admitting: Cardiology

## 2019-07-08 VITALS — BP 140/80

## 2019-07-08 DIAGNOSIS — I10 Essential (primary) hypertension: Secondary | ICD-10-CM

## 2019-07-08 DIAGNOSIS — I493 Ventricular premature depolarization: Secondary | ICD-10-CM

## 2019-07-08 MED ORDER — DILTIAZEM HCL ER COATED BEADS 120 MG PO CP24: 120.0000 mg | ORAL_CAPSULE | Freq: Every day | ORAL | 3 refills | Status: DC

## 2019-07-08 NOTE — Progress Notes (Signed)
Patient referred by Aura Dials, PA-C for irregular heart rate  Subjective:   Donald Cardenas, male    DOB: [birth year], 63 y.o.   MRN: [id number]  Chief complaint:  Symptomatic PVC's  HPI  63 y.o. Caucasian male with hypertension, type 2 DM, with symptomatic PVCs.  Echocardiogram showed mod LVH, mod LA dilatation. Frequent PVC's noted on echocardiogram. At last visit, I had started the patient on metoprolol succinate 50 mg daily. However, he experienced leg edema with this. Thus, this was stopped by PCP, and he was started on spironolactone and lasix. With this combination, his leg swelling and blood pressure hs improved.   Past Medical History:  Diagnosis Date  . Anxiety 05/15/2019  . Borderline diabetes   . DM (diabetes mellitus) ([place]) 05/15/2019  . HTN (hypertension) 05/15/2019  . Hypertension      Past Surgical History:  Procedure Laterality Date  . VASECTOMY       Social History   Socioeconomic History  . Marital status: Married    Spouse name: Not on file  . Number of children: 1  . Years of education: Not on file  . Highest education level: Not on file  Occupational History  . Not on file  Social Needs  . Financial resource strain: Not on file  . Food insecurity    Worry: Not on file    Inability: Not on file  . Transportation needs    Medical: Not on file    Non-medical: Not on file  Tobacco Use  . Smoking status: Never Smoker  . Smokeless tobacco: Never Used  Substance and Sexual Activity  . Alcohol use: No  . Drug use: No  . Sexual activity: Not on file  Lifestyle  . Physical activity    Days per week: Not on file    Minutes per session: Not on file  . Stress: Not on file  Relationships  . Social Herbalist on phone: Not on file    Gets together: Not on file    Attends religious service: Not on file    Active member of club or organization: Not on file    Attends meetings of clubs or organizations: Not on file    Relationship  status: Not on file  . Intimate partner violence    Fear of current or ex partner: Not on file    Emotionally abused: Not on file    Physically abused: Not on file    Forced sexual activity: Not on file  Other Topics Concern  . Not on file  Social History Narrative  . Not on file     No family history on file.   Current Outpatient Medications on File Prior to Visit  Medication Sig Dispense Refill  . aspirin EC 81 MG tablet Take 81 mg by mouth daily.    . cetirizine (ZYRTEC) 10 MG tablet Take 10 mg by mouth daily.    . furosemide (LASIX) 20 MG tablet Take 20 mg by mouth 3 (three) times daily.    . potassium chloride (K-DUR) 10 MEQ tablet Take 10 mEq by mouth 2 (two) times a day.    . sildenafil (VIAGRA) 100 MG tablet Take 100 mg by mouth daily as needed for erectile dysfunction.    Marland Kitchen spironolactone (ALDACTONE) 25 MG tablet Take 25 mg by mouth daily.     No current facility-administered medications on file prior to visit.     Cardiovascular studies:  Echocardiogram 06/25/2019 :  Normal LV systolic function with EF 55%. Left ventricle cavity is normal in size. Moderate concentric hypertrophy of the left ventricle. Normal global wall motion. Normal diastolic filling pattern. Frequent PVC noted during exam. Calculated EF 55%. Left atrial cavity is moderately dilated at 4.7 cm. Structurally normal appearing tricuspid valve. Mild tricuspid regurgitation. No evidence of pulmonary hypertension.  EKG 05/15/2019: Sinus rhythm 66 bpm. Nonspecific ST depression  -Nondiagnostic.  Two PVC's seen.  Echocardiogram 2013: - Left ventricle: The cavity size was normal. There was mild  to moderate concentric hypertrophy. Systolic function was  normal. The estimated ejection fraction was in the range  of 60% to 65%.  - Aortic valve: Mildly calcified annulus.  - Mitral valve: Calcified annulus.  - Right ventricle: The cavity size was normal. Wall  thickness was mildly increased.  -  Right atrium: The atrium was mildly dilated.   Recent labs: 05/06/2019: Glucose 91. BUN/Cr 18/1.16 eGFR 67. Na/K 144/4.6.  Chol 144, TG 57, HDL 41, LDL 92.  HbA1C 5.7%   Review of Systems  Constitution: Negative for decreased appetite, malaise/fatigue, weight gain and weight loss.  HENT: Negative for congestion.   Eyes: Negative for visual disturbance.  Cardiovascular: Positive for palpitations (Occasional, short lasting). Negative for chest pain, dyspnea on exertion, leg swelling and syncope.  Respiratory: Negative for cough.   Endocrine: Negative for cold intolerance.  Hematologic/Lymphatic: Does not bruise/bleed easily.  Skin: Negative for itching and rash.  Musculoskeletal: Negative for myalgias.  Gastrointestinal: Negative for abdominal pain, nausea and vomiting.  Genitourinary: Negative for dysuria.  Neurological: Negative for dizziness and weakness.  Psychiatric/Behavioral: The patient is not nervous/anxious.   All other systems reviewed and are negative.        There were no vitals filed for this visit.   There is no height or weight on file to calculate BMI. There were no vitals filed for this visit.   Objective:   Physical Exam  Constitutional: He is oriented to person, place, and time. He appears well-developed and well-nourished. No distress.  HENT:  Head: Normocephalic and atraumatic.  Eyes: Pupils are equal, round, and reactive to light. Conjunctivae are normal.  Neck: No JVD present.  Cardiovascular: Normal rate, regular rhythm and intact distal pulses.  Pulmonary/Chest: Effort normal and breath sounds normal. He has no wheezes. He has no rales.  Abdominal: Soft. Bowel sounds are normal. There is no rebound.  Musculoskeletal:        General: Edema (Trace b/l) present.  Lymphadenopathy:    He has no cervical adenopathy.  Neurological: He is alert and oriented to person, place, and time. No cranial nerve deficit.  Skin: Skin is warm and dry.   Psychiatric: He has a normal mood and affect.  Nursing note and vitals reviewed.         Assessment & Recommendations:   63 y.o. Caucasian male with hypertension, type 2 DM, with symptomatic PVCs.  Symptomatic PVCs: Hypertensive changes on echocardiogram. Normal LVEF.  Unable to tolerate metoprolol due to leg edema. Started diltiazem 120 mg daily.  I will see him in 3 months to assess his blood pressure and frequency of PVC's. Will consider Holter monitor at that time.   Hypertension: Better controlled.   Continue management of diabetes and OSA. Will discuss statin therapy at next visit.   Nigel Mormon, MD [facility] [place] Pager: [phone] Office: [phone] If no answer Cell [phone]

## 2019-10-01 ENCOUNTER — Encounter: Payer: Self-pay | Admitting: Cardiology

## 2019-10-01 ENCOUNTER — Ambulatory Visit (INDEPENDENT_AMBULATORY_CARE_PROVIDER_SITE_OTHER): Payer: BLUE CROSS/BLUE SHIELD | Admitting: Cardiology

## 2019-10-01 ENCOUNTER — Other Ambulatory Visit: Payer: Self-pay

## 2019-10-01 VITALS — BP 139/79 | HR 62 | Ht 72.0 in | Wt 303.0 lb

## 2019-10-01 DIAGNOSIS — I493 Ventricular premature depolarization: Secondary | ICD-10-CM

## 2019-10-01 DIAGNOSIS — I10 Essential (primary) hypertension: Secondary | ICD-10-CM

## 2019-10-01 NOTE — Patient Instructions (Signed)
Physical activity recommendation (The Physical Activity Guidelines for Americans. JAMA 2018;Nov 12) At least 150-300 minutes a week of moderate-intensity, or 75-150 minutes a week of vigorous-intensity aerobic physical activity, or an equivalent combination of moderate- and vigorous-intensity aerobic activity. Adults should perform muscle-strengthening activities on 2 or more days a week. Older adults should do multicomponent physical activity that includes balance training as well as aerobic and muscle-strengthening activities. Benefits of increased physical activity include lower risk of mortality including cardiovascular mortality, lower risk of cardiovascular events and associated risk factors (hypertension and diabetes), and lower risk of many cancers (including bladder, breast, colon, endometrium, esophagus, kidney, lung, and stomach). Additional improvments have been seen in cognition, risk of dementia, anxiety and depression, improved bone health, lower risk of falls, and associated injuries.  Dietary recommendation The 2019 ACC/AHA guidelines promote nutrition as a main fixture of cardiovascular wellness, with a recommendation for a varied diet of fruit, vegetables, fish, legumes, and whole grains (Class I), as well as recommendations to reduce sodium, cholesterol, processed meats, and refined sugars (Class IIa recommendation).10 Sodium intake, a topic of some controversy as of late, is recommended to be kept at 1,500 mg/day or less, far below the average daily intake in the [place] of 3,409 mg/day, and notably below that of previous [place] recommendations for <2,300mg/day.10,11 For those unable to reach 1,500 mg/day, they recommend at least a reduction of 1000 mg/day.  A Pesco-Mediterranean Diet With Intermittent Fasting: JACC Review Topic of the Week. J Am Coll Cardiol 2020;76:1484-1493 Pesco-Mediterranean diet, it is supplemented with extra-virgin olive oil (EVOO), which is the principle fat source, along  with moderate amounts of dairy (particularly yogurt and cheese) and eggs, as well as modest amounts of alcohol consumption (ideally red wine with the evening meal), but few red and processed meats.  

## 2019-10-01 NOTE — Progress Notes (Signed)
Patient referred by Aura Dials, PA-C for irregular heart rate  Subjective:   Donald Cardenas, male    DOB: [birth year], 63 y.o.   MRN: [id number]  Chief complaint:  Symptomatic PVC's  HPI  63 y.o. Caucasian male with hypertension, type 2 DM, with symptomatic PVCs.  Echocardiogram showed mod LVH, mod LA dilatation. Frequent PVC's noted on echocardiogram. At last visit, I started him on diltiazem 120 mg daily.  He was unable to tolerate metoprolol due to leg edema.  Patient has not had any recurrent palpitations, shortness of breath symptoms.  He walks greater than 10,000 steps every day at work.  Although this is cumulative walking distance through the day, he denies any anginal symptoms.  Patient is under tremendous stress at work lately.  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  This has led to lack of regular exercise and weight gain with unhealthy lifestyle.  Work stress is certainly taking a toll on him.  Past Medical History:  Diagnosis Date   Anxiety 05/15/2019   Borderline diabetes    DM (diabetes mellitus) ([place]) 05/15/2019   HTN (hypertension) 05/15/2019   Hypertension      Past Surgical History:  Procedure Laterality Date   VASECTOMY       Social History   Socioeconomic History   Marital status: Married    Spouse name: Not on file   Number of children: 1   Years of education: Not on file   Highest education level: Not on file  Occupational History   Not on file  Social Needs   Financial resource strain: Not on file   Food insecurity    Worry: Not on file    Inability: Not on file   Transportation needs    Medical: Not on file    Non-medical: Not on file  Tobacco Use   Smoking status: Never Smoker   Smokeless tobacco: Never Used  Substance and Sexual Activity   Alcohol use: No   Drug use: No   Sexual activity: Not on file  Lifestyle   Physical activity   Days per week: Not on file    Minutes per session: Not on file   Stress: Not on file  Relationships   Social connections    Talks on phone: Not on file    Gets together: Not on file    Attends religious service: Not on file    Active member of club or organization: Not on file    Attends meetings of clubs or organizations: Not on file    Relationship status: Not on file   Intimate partner violence    Fear of current or ex partner: Not on file    Emotionally abused: Not on file    Physically abused: Not on file    Forced sexual activity: Not on file  Other Topics Concern   Not on file  Social History Narrative   Not on file     No family history on file.   Current Outpatient Medications on File Prior to Visit  Medication Sig Dispense Refill   aspirin EC 81 MG tablet Take 81 mg by mouth daily.     cetirizine (ZYRTEC) 10 MG tablet Take 10 mg by mouth daily.     diltiazem (CARDIZEM CD) 120 MG 24 hr capsule Take 1 capsule (120 mg total) by mouth daily. 90 capsule 3  furosemide (LASIX) 20 MG tablet Take 20 mg by mouth as directed. 2 in the morning, 1 at lunch     potassium chloride (K-DUR) 10 MEQ tablet Take 10 mEq by mouth 2 (two) times a day.     sildenafil (VIAGRA) 100 MG tablet Take 100 mg by mouth daily as needed for erectile dysfunction.     spironolactone (ALDACTONE) 25 MG tablet Take 25 mg by mouth daily.     No current facility-administered medications on file prior to visit.     Cardiovascular studies:  EKG 10/01/2019: Sinus rhythm 62 bpm. Normal EKG.  Echocardiogram 06/25/2019 :  Normal LV systolic function with EF 55%. Left ventricle cavity is normal in size. Moderate concentric hypertrophy of the left ventricle. Normal global wall motion. Normal diastolic filling pattern. Frequent PVC noted during exam. Calculated EF 55%. Left atrial cavity is moderately dilated at 4.7 cm. Structurally normal appearing tricuspid valve. Mild tricuspid regurgitation.  No evidence of pulmonary hypertension.  EKG 05/15/2019: Sinus rhythm 66 bpm. Nonspecific ST depression  -Nondiagnostic.  Two PVC's seen.  Echocardiogram 2013: - Left ventricle: The cavity size was normal. There was mild  to moderate concentric hypertrophy. Systolic function was  normal. The estimated ejection fraction was in the range  of 60% to 65%.  - Aortic valve: Mildly calcified annulus.  - Mitral valve: Calcified annulus.  - Right ventricle: The cavity size was normal. Wall  thickness was mildly increased.  - Right atrium: The atrium was mildly dilated.   Recent labs: 05/06/2019: Glucose 91. BUN/Cr 18/1.16 eGFR 67. Na/K 144/4.6.  Chol 144, TG 57, HDL 41, LDL 92.  HbA1C 5.7%   Review of Systems  Constitution: Negative for decreased appetite, malaise/fatigue, weight gain and weight loss.  HENT: Negative for congestion.   Eyes: Negative for visual disturbance.  Cardiovascular: Positive for palpitations (Occasional, short lasting). Negative for chest pain, dyspnea on exertion, leg swelling and syncope.  Respiratory: Negative for cough.   Endocrine: Negative for cold intolerance.  Hematologic/Lymphatic: Does not bruise/bleed easily.  Skin: Negative for itching and rash.  Musculoskeletal: Negative for myalgias.  Gastrointestinal: Negative for abdominal pain, nausea and vomiting.  Genitourinary: Negative for dysuria.  Neurological: Negative for dizziness and weakness.  Psychiatric/Behavioral: The patient is not nervous/anxious.   All other systems reviewed and are negative.        Vitals:   10/01/19 1348  BP: 139/79  Pulse: 62  SpO2: 99%     Body mass index is 41.09 kg/m. Filed Weights   10/01/19 1348  Weight: (!) 137.4 kg     Objective:   Physical Exam  Constitutional: He is oriented to person, place, and time. He appears well-developed and well-nourished. No distress.  HENT:  Head: Normocephalic and atraumatic.  Eyes: Pupils are equal, round,  and reactive to light. Conjunctivae are normal.  Neck: No JVD present.  Cardiovascular: Normal rate, regular rhythm and intact distal pulses.  Pulmonary/Chest: Effort normal and breath sounds normal. He has no wheezes. He has no rales.  Abdominal: Soft. Bowel sounds are normal. There is no rebound.  Musculoskeletal:        General: Edema (Trace b/l) present.  Lymphadenopathy:    He has no cervical adenopathy.  Neurological: He is alert and oriented to person, place, and time. No cranial nerve deficit.  Skin: Skin is warm and dry.  Psychiatric: He has a normal mood and affect.  Nursing note and vitals reviewed.         Assessment &  Recommendations:   63 y.o. Caucasian male with hypertension, prediabetes, with symptomatic PVCs.  Symptomatic PVCs: Hypertensive changes on echocardiogram. Normal LVEF.  Continue diltiazem 120 mg daily.  Continue rest of the antihypertensive therapy.  Hypertension: Better controlled.   I recommend reduced work hours, although work from home, to allow the patient time to exercise and follow heart healthy lifestyle.  Physical activity recommendation (The Physical Activity Guidelines for Americans. JAMA 2018;Nov 12) At least 150-300 minutes a week of moderate-intensity, or 75-150 minutes a week of vigorous-intensity aerobic physical activity, or an equivalent combination of moderate- and vigorous-intensity aerobic activity. Adults should perform muscle-strengthening activities on 2 or more days a week. Older adults should do multicomponent physical activity that includes balance training as well as aerobic and muscle-strengthening activities. Benefits of increased physical activity include lower risk of mortality including cardiovascular mortality, lower risk of cardiovascular events and associated risk factors (hypertension and diabetes), and lower risk of many cancers (including bladder, breast, colon, endometrium, esophagus, kidney, lung, and stomach).  Additional improvments have been seen in cognition, risk of dementia, anxiety and depression, improved bone health, lower risk of falls, and associated injuries.  Dietary recommendation The 2019 ACC/AHA guidelines promote nutrition as a main fixture of cardiovascular wellness, with a recommendation for a varied diet of fruit, vegetables, fish, legumes, and whole grains (Class I), as well as recommendations to reduce sodium, cholesterol, processed meats, and refined sugars (Class IIa recommendation).10 Sodium intake, a topic of some controversy as of late, is recommended to be kept at 1,500 mg/day or less, far below the average daily intake in the [place] of 3,409 mg/day, and notably below that of previous [place] recommendations for <2,365m/day.10,11 For those unable to reach 1,500 mg/day, they recommend at least a reduction of 1000 mg/day.  A Pesco-Mediterranean Diet With Intermittent Fasting: JACC Review Topic of the Week. J Am Coll Cardiol 21464;31:4276-7011Pesco-Mediterranean diet, it is supplemented with extra-virgin olive oil (EVOO), which is the principle fat source, along with moderate amounts of dairy (particularly yogurt and cheese) and eggs, as well as modest amounts of alcohol consumption (ideally red wine with the evening meal), but few red and processed meats.  MNigel Mormon MD [facility] Pager: [phone]: [phone] no answer Cell [phone]

## 2019-10-28 ENCOUNTER — Other Ambulatory Visit: Payer: Self-pay

## 2019-10-28 DIAGNOSIS — Z20822 Contact with and (suspected) exposure to covid-19: Secondary | ICD-10-CM

## 2019-10-30 ENCOUNTER — Telehealth: Payer: Self-pay

## 2019-10-30 NOTE — Telephone Encounter (Signed)
Caller advise that result are not back yet 

## 2019-10-31 LAB — NOVEL CORONAVIRUS, NAA: SARS-CoV-2, NAA: NOT DETECTED

## 2020-03-31 ENCOUNTER — Ambulatory Visit: Payer: BC Managed Care – PPO | Admitting: Cardiology

## 2020-04-13 NOTE — Progress Notes (Signed)
    Patient referred by Aura Dials, PA-C for irregular heart rate  Subjective:   Donald Cardenas, male    DOB: 1956-08-18, 64 y.o.   MRN: [id number]  Chief complaint:  Symptomatic PVC's  HPI  64 y.o. Caucasian male with hypertension, type 2 DM, with symptomatic PVCs  At last visit in 09/2019, I recommended reduced or hours to allow patient time to exercise and follow heart healthy lifestyle. I discussed physical activity and dietary recommendations. His blood pressure was better controlled, I continued his hypertensive therapy. Blood pressure elevated today, but usually normal.    Current Outpatient Medications on File Prior to Visit  Medication Sig Dispense Refill  . aspirin EC 81 MG tablet Take 81 mg by mouth daily.    . cetirizine (ZYRTEC) 10 MG tablet Take 10 mg by mouth daily.    . furosemide (LASIX) 20 MG tablet Take 20 mg by mouth as directed. 2 in the morning, 1 at lunch    . potassium chloride (K-DUR) 10 MEQ tablet Take 10 mEq by mouth 2 (two) times a day.    . sildenafil (VIAGRA) 100 MG tablet Take 100 mg by mouth daily as needed for erectile dysfunction.    Marland Kitchen spironolactone (ALDACTONE) 25 MG tablet Take 25 mg by mouth daily.     No current facility-administered medications on file prior to visit.    Cardiovascular studies:  EKG 04/15/2020: Sinus rhythm 61 bpm IVCD  Echocardiogram 06/25/2019 :  Normal LV systolic function with EF 55%. Left ventricle cavity is normal in size. Moderate concentric hypertrophy of the left ventricle. Normal global wall motion. Normal diastolic filling pattern. Frequent PVC noted during exam. Calculated EF 55%. Left atrial cavity is moderately dilated at 4.7 cm. Structurally normal appearing tricuspid valve. Mild tricuspid regurgitation. No evidence of pulmonary hypertension.   Recent labs: 05/06/2019: Glucose 91. BUN/Cr 18/1.16 eGFR 67. Na/K 144/4.6.  Chol 144, TG 57, HDL 41, LDL 92.  HbA1C 5.7%   Review of Systems   Cardiovascular: Positive for palpitations (Occasional, short lasting). Negative for chest pain, dyspnea on exertion, leg swelling and syncope.         Vitals:   04/15/20 1406  BP: (!) 155/87  Pulse: 63  SpO2: 97%     Body mass index is 42.17 kg/m. Filed Weights   04/15/20 1406  Weight: (!) 310 lb 14.4 oz (141 kg)     Objective:   Physical Exam  Constitutional: No distress.  Neck: No JVD present.  Cardiovascular: Normal rate, regular rhythm, normal heart sounds and intact distal pulses.  No murmur heard. Pulmonary/Chest: Effort normal and breath sounds normal. He has no wheezes. He has no rales.  Musculoskeletal:        General: Edema (Trace b/l) present.  Nursing note and vitals reviewed.       Assessment & Recommendations:   64 y.o. Caucasian male with hypertension, prediabetes, with symptomatic PVCs.  Symptomatic PVCs: Improved on diltiazem.  Hypertension: Hypertensive changes on echocardiogram. Normal LVEF.  Continue diltiazem 120 mg daily.  Blood pressure elevated but usually lower. Conitnue monitoring at home and f/u w/PCP.Continue rest of the antihypertensive therapy.  I will see him on as needed basis.  Nigel Mormon, MD [facility] [place] Pager: [phone] Office: [phone] If no answer Cell [phone]

## 2020-04-15 ENCOUNTER — Ambulatory Visit: Payer: BC Managed Care – PPO | Admitting: Cardiology

## 2020-04-15 ENCOUNTER — Other Ambulatory Visit: Payer: Self-pay

## 2020-04-15 ENCOUNTER — Encounter: Payer: Self-pay | Admitting: Cardiology

## 2020-04-15 VITALS — BP 155/87 | HR 63 | Ht 72.0 in | Wt 310.9 lb

## 2020-04-15 DIAGNOSIS — I10 Essential (primary) hypertension: Secondary | ICD-10-CM

## 2020-04-15 DIAGNOSIS — I493 Ventricular premature depolarization: Secondary | ICD-10-CM

## 2020-04-16 ENCOUNTER — Encounter: Payer: Self-pay | Admitting: Cardiology

## 2020-07-07 ENCOUNTER — Encounter (HOSPITAL_BASED_OUTPATIENT_CLINIC_OR_DEPARTMENT_OTHER): Payer: Self-pay | Admitting: *Deleted

## 2020-07-07 ENCOUNTER — Emergency Department (HOSPITAL_BASED_OUTPATIENT_CLINIC_OR_DEPARTMENT_OTHER)
Admission: EM | Admit: 2020-07-07 | Discharge: 2020-07-07 | Disposition: A | Discharge status: Home / Self Care | Payer: Worker's Compensation | Attending: Emergency Medicine | Admitting: Emergency Medicine

## 2020-07-07 ENCOUNTER — Other Ambulatory Visit: Payer: Self-pay

## 2020-07-07 ENCOUNTER — Emergency Department (HOSPITAL_BASED_OUTPATIENT_CLINIC_OR_DEPARTMENT_OTHER): Payer: Worker's Compensation

## 2020-07-07 DIAGNOSIS — Z79899 Other long term (current) drug therapy: Secondary | ICD-10-CM | POA: Diagnosis not present

## 2020-07-07 DIAGNOSIS — Y9289 Other specified places as the place of occurrence of the external cause: Secondary | ICD-10-CM | POA: Insufficient documentation

## 2020-07-07 DIAGNOSIS — S60419A Abrasion of unspecified finger, initial encounter: Secondary | ICD-10-CM

## 2020-07-07 DIAGNOSIS — W19XXXA Unspecified fall, initial encounter: Secondary | ICD-10-CM

## 2020-07-07 DIAGNOSIS — I10 Essential (primary) hypertension: Secondary | ICD-10-CM | POA: Diagnosis not present

## 2020-07-07 DIAGNOSIS — Y999 Unspecified external cause status: Secondary | ICD-10-CM | POA: Diagnosis not present

## 2020-07-07 DIAGNOSIS — W228XXA Striking against or struck by other objects, initial encounter: Secondary | ICD-10-CM | POA: Diagnosis not present

## 2020-07-07 DIAGNOSIS — E119 Type 2 diabetes mellitus without complications: Secondary | ICD-10-CM | POA: Insufficient documentation

## 2020-07-07 DIAGNOSIS — S60417A Abrasion of left little finger, initial encounter: Secondary | ICD-10-CM | POA: Diagnosis not present

## 2020-07-07 DIAGNOSIS — Y9301 Activity, walking, marching and hiking: Secondary | ICD-10-CM | POA: Diagnosis not present

## 2020-07-07 DIAGNOSIS — S0001XA Abrasion of scalp, initial encounter: Secondary | ICD-10-CM | POA: Diagnosis not present

## 2020-07-07 DIAGNOSIS — S0990XA Unspecified injury of head, initial encounter: Secondary | ICD-10-CM | POA: Diagnosis present

## 2020-07-07 MED ORDER — TRAMADOL HCL 50 MG PO TABS: 50.0000 mg | ORAL_TABLET | Freq: Once | ORAL | Status: DC

## 2020-07-07 MED ORDER — IBUPROFEN 800 MG PO TABS
800.0000 mg | ORAL_TABLET | Freq: Once | ORAL | Status: AC
  Administered 2020-07-07: 800 mg via ORAL
  Filled 2020-07-07: qty 1

## 2020-07-07 MED ORDER — TRAMADOL HCL 50 MG PO TABS: 50.0000 mg | ORAL_TABLET | Freq: Four times a day (QID) | ORAL | 0 refills | Status: DC | PRN

## 2020-07-07 NOTE — ED Notes (Signed)
Per supervisor UDS done PTA

## 2020-07-07 NOTE — Discharge Instructions (Signed)
Your CT scan today showed no bleeding.  I expect you to be stiff and sore all over.  You are on baclofen and so continue to take the baclofen.  Consider to take some Tylenol or ibuprofen for pain.  Take some tramadol for severe pain but do not drive with it.  See your doctor for follow-up.  Return to ER if you have worse headaches, vomiting, weakness, arm pain.

## 2020-07-07 NOTE — ED Provider Notes (Signed)
[facility] EMERGENCY DEPARTMENT Provider Note   CSN: [id number] Arrival date & time: 07/07/20  1618     History Chief Complaint  Patient presents with   Head Injury    Donald Cardenas is a 64 y.o. male history of borderline diabetes, hypertension, here presenting with head injury.  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  He states that he fell face forward and hit his head as well as left arm.  He was driven here by a Merchandiser, retail at work.  No meds prior to arrival.  Patient states that his tetanus shot is up-to-date.  The history is provided by the patient.       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 No pertinent family history.  Social History   Tobacco Use   Smoking status: Never Smoker   Smokeless tobacco: Never Used  Vaping Use   Vaping Use: Never used  Substance Use Topics   Alcohol use: No   Drug use: No    Home Medications Prior to Admission medications   Medication Sig Start Date End Date Taking? Authorizing Provider  aspirin EC 81 MG tablet Take 81 mg by mouth daily.    [provider]  baclofen (LIORESAL) 10 MG tablet Take 1 tablet by mouth 3 (three) times daily as needed. 03/21/20 03/21/21  [provider]  cetirizine (ZYRTEC) 10 MG tablet Take 10 mg by mouth daily.    [provider]  diltiazem (TIAZAC) 120 MG 24 hr capsule Take 1 capsule by mouth daily. 07/08/19   [provider]    furosemide (LASIX) 20 MG tablet Take 20 mg by mouth as directed. 2 in the morning, 1 at lunch    [provider]  potassium chloride (K-DUR) 10 MEQ tablet Take 10 mEq by mouth 2 (two) times a day.    [provider]  sildenafil (VIAGRA) 100 MG tablet Take 100 mg by mouth daily as needed for erectile dysfunction.    [provider]  spironolactone (ALDACTONE) 25 MG tablet Take 25 mg by mouth daily.    [provider]  traMADol (ULTRAM) 50 MG tablet Take 1 tablet (50 mg total) by mouth every 6 (six) hours as needed. 07/07/20   Charlynne Pander, MD    Allergies    Benazepril and Penicillins  Review of Systems   Review of Systems  Skin: Positive for wound.  Neurological: Positive for headaches.  All other systems reviewed and are negative.   Physical Exam Updated Vital Signs BP (!) 151/84 (BP Location: Right Arm)    Pulse 80    Temp 98.8 F (37.1 C) (Oral)    Resp 18    Ht 6\' 1"  (1.854 m)    Wt 136.1 kg    SpO2 97%    BMI 39.58 kg/m   Physical Exam Vitals and nursing note reviewed.  HENT:     Head:  Normocephalic.     Comments: Frontal scalp abrasion    Nose: Nose normal.     Comments: No obvious nasal bone deformity Eyes:     Extraocular Movements: Extraocular movements intact.     Pupils: Pupils are equal, round, and reactive to light.  Cardiovascular:     Rate and Rhythm: Normal rate and regular rhythm.     Pulses: Normal pulses.     Heart sounds: Normal heart sounds.  Pulmonary:     Effort: Pulmonary effort is normal.     Breath sounds: Normal breath sounds.     Comments: No bruising or tenderness on the chest. Abdominal:     General: Abdomen is flat.     Palpations: Abdomen is soft.  Musculoskeletal:        General: Normal range of motion.     Cervical back: Normal range of motion.     Comments: Abrasion on the left fifth finger.  No bony tenderness on the finger or hand or wrist or forearm or elbow or upper arm.  No spinal  tenderness.  Patient normal range of motion bilateral hips and no other extremity injuries.  Skin:    General: Skin is warm.     Capillary Refill: Capillary refill takes less than 2 seconds.  Neurological:     General: No focal deficit present.     Mental Status: He is alert and oriented to person, place, and time.     Cranial Nerves: No cranial nerve deficit.     Motor: No weakness.  Psychiatric:        Mood and Affect: Mood normal.        Behavior: Behavior normal.     ED Results / Procedures / Treatments   Labs (all labs ordered are listed, but only abnormal results are displayed) Labs Reviewed - No data to display  EKG None  Radiology CT Head Wo Contrast  Result Date: 07/07/2020 CLINICAL DATA:  Fall EXAM: CT HEAD WITHOUT CONTRAST TECHNIQUE: Contiguous axial images were obtained from the base of the skull through the vertex without intravenous contrast. COMPARISON:  None. FINDINGS: Brain: There is no acute intracranial hemorrhage, mass effect, or edema. Gray-white differentiation is preserved. There is no extra-axial fluid collection. Ventricles and sulci are within normal limits in size and configuration. Vascular: No hyperdense vessel or unexpected calcification. Skull: Calvarium is unremarkable. Sinuses/Orbits: No acute finding. Other: None. IMPRESSION: No evidence of acute intracranial injury. Electronically Signed   By: Guadlupe Spanish M.D.   On: 07/07/2020 17:03    Procedures Procedures (including critical care time)  Medications Ordered in ED Medications  ibuprofen (ADVIL) tablet 800 mg (has no administration in time range)    ED Course  I have reviewed the triage vital signs and the nursing notes.  Pertinent labs & imaging results that were available during my care of the patient were reviewed by me and considered in my medical decision making (see chart for details).    MDM Rules/Calculators/A&P                          Donald Cardenas is a 65 y.o. male  presenting with fall.  Patient had mechanical fall and hit his head.  Patient nonfocal neuro exam and CT head is unremarkable.  He has abrasions on the left fifth finger but able to flex and extend the finger and there is no obvious bony tenderness.  I offered x-rays of the hand but  he declined.  Patient is on baclofen as needed and I recommend he takes his baclofen as well as ibuprofen.  Will give tramadol for pain as well.  Patient's tetanus is up-to-date.  Gave strict return precautions.  Final Clinical Impression(s) / ED Diagnoses Final diagnoses:  Abrasion of scalp, initial encounter  Abrasion of finger, initial encounter  Fall, initial encounter    Rx / DC Orders ED Discharge Orders         Ordered    traMADol (ULTRAM) 50 MG tablet  Every 6 hours PRN     Discontinue  Reprint     07/07/20 1735           Charlynne Pander, MD 07/07/20 [phone]

## 2020-07-07 NOTE — ED Triage Notes (Signed)
Pt c/o fall with head injury x 4 hrs ago , NO LOC

## 2020-08-02 ENCOUNTER — Telehealth: Payer: Self-pay | Admitting: Orthopaedic Surgery

## 2020-08-02 NOTE — Telephone Encounter (Addendum)
Patient called and stated he needed to schedule an appointment for fractures of both elbows.  He said this would be covered under WC.  He said he went to [facility] on 07/07/20.    I told him that I would need to get the notes and approval from the [facility].  I called and left a message for his WC adjustor, Pecolia Ades from, [place] [phone].  I tried to get the notes from the Occupational Health but was told that the patient would need to sign a release.  Mr. Hinze did come back and sign the release.  It was faxed over to their office at [phone].    Vea Storey WC adjustor did call me back regarding this patient.  She did not have much information to go on for this patient either.  She will also need to get the notes and speak to the patient before giving approval for him to be seen here.  I told her that I also would have to let our doctors review his notes to see if they would give approval for him to be seen.  She understood and will get back to [place].  I am awaiting notes from [facility].  Notes have been sent by WC.  Dr Kirtland Bouchard reviewed and approved.  Pt and WC notified of appointment for Tuesday, 08/09/20 at 8:10

## 2020-08-09 ENCOUNTER — Ambulatory Visit: Payer: BC Managed Care – PPO

## 2020-08-09 ENCOUNTER — Encounter: Payer: Self-pay | Admitting: Orthopaedic Surgery

## 2020-08-09 ENCOUNTER — Ambulatory Visit (INDEPENDENT_AMBULATORY_CARE_PROVIDER_SITE_OTHER): Payer: Worker's Compensation | Admitting: Orthopaedic Surgery

## 2020-08-09 ENCOUNTER — Other Ambulatory Visit: Payer: Self-pay

## 2020-08-09 VITALS — BP 159/88 | HR 73 | Ht 71.5 in | Wt 307.0 lb

## 2020-08-09 DIAGNOSIS — G8929 Other chronic pain: Secondary | ICD-10-CM

## 2020-08-09 DIAGNOSIS — M25521 Pain in right elbow: Secondary | ICD-10-CM | POA: Diagnosis not present

## 2020-08-09 DIAGNOSIS — S52122A Displaced fracture of head of left radius, initial encounter for closed fracture: Secondary | ICD-10-CM

## 2020-08-09 DIAGNOSIS — S52021A Displaced fracture of olecranon process without intraarticular extension of right ulna, initial encounter for closed fracture: Secondary | ICD-10-CM

## 2020-08-09 DIAGNOSIS — M25522 Pain in left elbow: Secondary | ICD-10-CM | POA: Diagnosis not present

## 2020-08-09 NOTE — Progress Notes (Signed)
Subjective:    Patient ID: Donald Cardenas, male    DOB: 01-Mar-1956, 64 y.o.   MRN: [id number]  HPI He has bilateral elbow injuries that occurred on the job on 07-07-2020 around 3PM while working at [employer].  He was walking on sidewalk to get a laptop.  Someone called him from behind as he was walking.  He turned and caught his foot on a raised section of sidewalk and he fell and hit both elbows and his head. He had immediate pain.  He was seen at [facility] and had x-rays done/CT done of the head which was negative.  He did not have x-rays of the elbows.  He complained of left hand pain also.  His head became less of a problem with no residual.  However the elbow hurt.  He was seen at [facility] on 07-27-20.  X-rays were done of the elbows which showed a left radial head fracture and a displaced right fracture of spur formation of proximal ulnar.  He has no cast on.  He has more pain in the left elbow and cannot fully extend it.  His right elbow has an area of marked tenderness just proximal to the elbow.  He works in Consulting civil engineer.   Review of Systems  Constitutional: Positive for activity change.  Musculoskeletal: Positive for arthralgias and joint swelling.  All other systems reviewed and are negative.  For Review of Systems, all other systems reviewed and are negative.  The following is a summary of the past history medically, past history surgically, known current medicines, social history and family history.  This information is gathered electronically by the computer from prior information and documentation.  I review this each visit and have found including this information at this point in the chart is beneficial and informative.   Past Medical History:  Diagnosis Date   Anxiety 05/15/2019   Borderline diabetes    DM (diabetes mellitus) (HCC) 05/15/2019   HTN (hypertension) 05/15/2019   Hypertension     Past Surgical History:    Procedure Laterality Date   VASECTOMY      Current Outpatient Medications on File Prior to Visit  Medication Sig Dispense Refill   aspirin EC 81 MG tablet Take 81 mg by mouth daily.     baclofen (LIORESAL) 10 MG tablet Take 1 tablet by mouth 3 (three) times daily as needed.     cetirizine (ZYRTEC) 10 MG tablet Take 10 mg by mouth daily.     diltiazem (TIAZAC) 120 MG 24 hr capsule Take 1 capsule by mouth daily.     furosemide (LASIX) 20 MG tablet Take 20 mg by mouth as directed. 2 in the morning, 1 at lunch     potassium chloride (K-DUR) 10 MEQ tablet Take 10 mEq by mouth 2 (two) times a day.     sildenafil (VIAGRA) 100 MG tablet Take 100 mg by mouth daily as needed for erectile dysfunction.     spironolactone (ALDACTONE) 25 MG tablet Take 25 mg by mouth daily.     No current facility-administered medications on file prior to visit.    Social History   Socioeconomic History   Marital status: Married    Spouse name: Not on file   Number of children: 1   Years of education: Not on file   Highest education level: Not on file  Occupational History   Not on file  Tobacco Use   Smoking status: Never  Smoker   Smokeless tobacco: Never Used  Vaping Use   Vaping Use: Never used  Substance and Sexual Activity   Alcohol use: No   Drug use: No   Sexual activity: Not on file  Other Topics Concern   Not on file  Social History Narrative   Not on file   Social Determinants of Health   Financial Resource Strain:    Difficulty of Paying Living Expenses: Not on file  Food Insecurity:    Worried About Running Out of Food in the Last Year: Not on file   Ran Out of Food in the Last Year: Not on file  Transportation Needs:    Lack of Transportation (Medical): Not on file   Lack of Transportation (Non-Medical): Not on file  Physical Activity:    Days of Exercise per Week: Not on file   Minutes of Exercise per Session: Not on file  Stress:    Feeling of  Stress : Not on file  Social Connections:    Frequency of Communication with Friends and Family: Not on file   Frequency of Social Gatherings with Friends and Family: Not on file   Attends Religious Services: Not on file   Active Member of Clubs or Organizations: Not on file   Attends Banker Meetings: Not on file   Marital Status: Not on file  Intimate Partner Violence:    Fear of Current or Ex-Partner: Not on file   Emotionally Abused: Not on file   Physically Abused: Not on file   Sexually Abused: Not on file    No family history on file.  BP (!) 159/88    Pulse 73    Ht 5' 11.5" (1.816 m)    Wt (!) 307 lb (139.3 kg)    BMI 42.22 kg/m   Body mass index is 42.22 kg/m.     Objective:   Physical Exam Vitals and nursing note reviewed.  Constitutional:      Appearance: He is well-developed.  HENT:     Head: Normocephalic and atraumatic.  Eyes:     Conjunctiva/sclera: Conjunctivae normal.     Pupils: Pupils are equal, round, and reactive to light.  Cardiovascular:     Rate and Rhythm: Normal rate and regular rhythm.  Pulmonary:     Effort: Pulmonary effort is normal.  Abdominal:     Palpations: Abdomen is soft.  Musculoskeletal:       Arms:     Cervical back: Normal range of motion and neck supple.  Skin:    General: Skin is warm and dry.  Neurological:     Mental Status: He is alert and oriented to person, place, and time.     Cranial Nerves: No cranial nerve deficit.     Motor: No abnormal muscle tone.     Coordination: Coordination normal.     Deep Tendon Reflexes: Reflexes are normal and symmetric. Reflexes normal.  Psychiatric:        Behavior: Behavior normal.        Thought Content: Thought content normal.        Judgment: Judgment normal.    X-rays were done of both elbows, reported separately.       Assessment & Plan:   Encounter Diagnoses  Name Primary?   Chronic elbow pain, right Yes   Chronic elbow pain, left     Closed displaced fracture of head of left radius, initial encounter    Closed extra-articular fracture of olecranon, right, initial encounter  Begin OT for the left elbow and the right.  Avoid heavy lifting.  Return in two weeks.  Call if any problem.  Precautions discussed.   Electronically Signed Darreld Mclean, MD 9/14/20219:36 AM

## 2020-08-15 ENCOUNTER — Telehealth: Payer: Self-pay | Admitting: Orthopaedic Surgery

## 2020-08-15 NOTE — Telephone Encounter (Signed)
Patient called and wanted to let the office know that he would like the extra piece of bone fragment in the removed and discuss with another provider. No other concerns.

## 2020-08-17 ENCOUNTER — Ambulatory Visit (HOSPITAL_COMMUNITY): Payer: Worker's Compensation | Admitting: Specialist

## 2020-08-18 ENCOUNTER — Ambulatory Visit (HOSPITAL_COMMUNITY): Payer: Worker's Compensation

## 2020-08-18 ENCOUNTER — Telehealth: Payer: Self-pay | Admitting: Orthopaedic Surgery

## 2020-08-18 NOTE — Telephone Encounter (Signed)
Patient relays he has been scheduled for therapy per worker's comp at [facility]); said appointment for today, 08/18/20 has been postponed and rescheduled per therapy department to 08/29/20; states had a covid test, and although negative, relayed that he is currently on medication for cough. Advise to reschedule his 08/23/20 appointment here also?* *In addition, patient would like to"go ahead and see one of our surgeons based on what he and Dr Hilda Lias discussed" at his office visit.  Please advise.

## 2020-08-18 NOTE — Telephone Encounter (Signed)
reschedule

## 2020-08-19 NOTE — Telephone Encounter (Signed)
Patient voiced understanding - opts to have his visit by telephone/virtual, in order to discuss as noted.

## 2020-08-23 ENCOUNTER — Encounter: Payer: Self-pay | Admitting: Orthopaedic Surgery

## 2020-08-23 ENCOUNTER — Ambulatory Visit (INDEPENDENT_AMBULATORY_CARE_PROVIDER_SITE_OTHER): Payer: Worker's Compensation | Admitting: Orthopaedic Surgery

## 2020-08-23 ENCOUNTER — Other Ambulatory Visit: Payer: Self-pay

## 2020-08-23 DIAGNOSIS — S52021D Displaced fracture of olecranon process without intraarticular extension of right ulna, subsequent encounter for closed fracture with routine healing: Secondary | ICD-10-CM

## 2020-08-23 DIAGNOSIS — S52122D Displaced fracture of head of left radius, subsequent encounter for closed fracture with routine healing: Secondary | ICD-10-CM

## 2020-08-23 NOTE — Progress Notes (Signed)
Virtual Visit via Telephone Note  I connected with@ on 08/23/20 at 10:30 AM EDT by telephone and verified that I am speaking with the correct person using two identifiers.  Location: Patient: home Provider: [place] office.   I discussed the limitations, risks, security and privacy concerns of performing an evaluation and management service by telephone and the availability of in person appointments. I also discussed with the patient that there may be a patient responsible charge related to this service. The patient expressed understanding and agreed to proceed.   History of Present Illness: He had a cough and was seen by his family doctor.  He got a COVID test and it was negative.  But the OT department said they could not see him for 14 days after the test was done which will be October 4.  He will begin OT then.  He has some stiffness of the elbows.  He has tried to do some exercises at home.  I have told him about some easy exercises to do.    I will see him in two weeks.  I will give him a note to return to work on October 5.    Observations/Objective: Per above.  Assessment and Plan: Encounter Diagnoses  Name Primary?  . Closed displaced fracture of head of left radius with routine healing, subsequent encounter Yes  . Closed extra-articular fracture of olecranon, right, with routine healing, subsequent encounter    He would like to consider excision of the bony fragment on the right elbow.  I would like him to go to OT first and see how he does.  He could have this done later if needed.  Follow Up Instructions: Two weeks in office.  X-rays of both elbows. Go to OT.   I discussed the assessment and treatment plan with the patient. The patient was provided an opportunity to ask questions and all were answered. The patient agreed with the plan and demonstrated an understanding of the instructions.   The patient was advised to call back or seek an in-person evaluation if the  symptoms worsen or if the condition fails to improve as anticipated.  I provided 14 minutes of non-face-to-face time during this encounter.   Darreld Mclean, MD

## 2020-08-23 NOTE — Patient Instructions (Signed)
OT October 4  Return to work October 5

## 2020-08-29 ENCOUNTER — Other Ambulatory Visit: Payer: Self-pay

## 2020-08-29 ENCOUNTER — Ambulatory Visit (HOSPITAL_COMMUNITY): Payer: Worker's Compensation | Attending: Orthopaedic Surgery | Admitting: Specialist

## 2020-08-29 DIAGNOSIS — M25522 Pain in left elbow: Secondary | ICD-10-CM | POA: Diagnosis present

## 2020-08-29 DIAGNOSIS — M25521 Pain in right elbow: Secondary | ICD-10-CM | POA: Insufficient documentation

## 2020-08-29 DIAGNOSIS — M25622 Stiffness of left elbow, not elsewhere classified: Secondary | ICD-10-CM | POA: Insufficient documentation

## 2020-08-29 NOTE — Patient Instructions (Signed)
Elbow: Extension    Place thick telephone book on table and rest upper arm on it. Grasp forearm with other hand and use a steady downward and outward pull to straighten elbow. Hold ____ seconds. Repeat ____ times. Do ____ sessions per day. CAUTION: Stretch slowly and gently. Do not force joint.  Copyright  VHI. All rights reserved.  Supination (Active)    With elbow held at right angle and kept at side, turn palm upward as far as possible. Hold ____ seconds. Repeat ____ times. Do ____ sessions per day. Activity: Use this motion when turning cards over.*  Copyright  VHI. All rights reserved.

## 2020-08-29 NOTE — Therapy (Signed)
[place] [address] [place], [place], [address] Phone: [phone]   Fax:  [phone]  Occupational Therapy Evaluation  Patient Details  Name: Donald Cardenas MRN: [id number] Date of Birth: 1956-03-31 Referring Provider (OT): Dr. Darreld Mclean   Encounter Date: 08/29/2020   OT End of Session - 08/29/20 1535    Visit Number 1    Number of Visits 4    Date for OT Re-Evaluation 09/19/20    Authorization Type Workers Comp approved 4 visits    Authorization - Visit Number 1    Authorization - Number of Visits 4    OT Start Time 1030    OT Stop Time 1115    OT Time Calculation (min) 45 min    Activity Tolerance Patient tolerated treatment well    Behavior During Therapy [facility] for tasks assessed/performed           Past Medical History:  Diagnosis Date   Anxiety 05/15/2019   Borderline diabetes    DM (diabetes mellitus) (HCC) 05/15/2019   HTN (hypertension) 05/15/2019   Hypertension     Past Surgical History:  Procedure Laterality Date   VASECTOMY      There were no vitals filed for this visit.   Subjective Assessment - 08/29/20 1534    Subjective  S:  I have been trying to work it as much as I can.    Pertinent History on 07/07/20 Donald Cardenas tripped on the sidewalk at his place of employment and fell, cutting his head and injuring his arms.  However, his BUE were not xrayed for 3 weeks, at which time he was diagnosed with a left closed displaced fracture of the left radial head and right olecranon extra articular fracture.  He has been referred to occupational therapy for evaluation and treatment by Dr. Hilda Lias.    Patient Stated Goals I want to get back to doing what I was    Currently in Pain? Yes    Pain Score 3     Pain Location Elbow    Pain Orientation Left             [facility] OT Assessment - 08/29/20 0001      Assessment   Medical Diagnosis S/P Left closed displaced radial head fracture and right olecranon  extra articular fracture     Referring Provider (OT) Dr. Darreld Mclean    Onset Date/Surgical Date 07/07/20    Hand Dominance Right    Prior Therapy n/a       Precautions   Precautions None      Restrictions   Weight Bearing Restrictions No      Balance Screen   Has the patient fallen in the past 6 months No    Has the patient had a decrease in activity level because of a fear of falling?  No    Is the patient reluctant to leave their home because of a fear of falling?  No      Home  Environment   Family/patient expects to be discharged to: Private residence    Living Arrangements Spouse/significant other      Prior Function   Level of Independence Independent    Vocation Full time employment    Vocation Requirements IT with [employer]     Leisure repairs around his home, staying active       ADL   ADL comments left arm is painful when he uses it to push up from the bed  or a chair, he does not have full range which makes daily chores involving reaching out in front of his body difficult.  his arms fatigue quickly.  he has not returned to work in the office, he has worked remotely      Written Expression   Dominant Hand Right      Vision - History   Baseline Vision No visual deficits      Cognition   Overall Cognitive Status Within Functional Limits for tasks assessed      Observation/Other Assessments   Focus on Therapeutic Outcomes (FOTO)  see rom and strenght      Sensation   Light Touch Appears Intact      Coordination   Gross Motor Movements are Fluid and Coordinated Yes    Fine Motor Movements are Fluid and Coordinated Yes      ROM / Strength   AROM / PROM / Strength AROM;Strength      Palpation   Palpation comment min fascial restrictions noted in his left elbow region       AROM   Overall AROM Comments assessed seated    AROM Assessment Site Elbow;Forearm    Right/Left Elbow Right;Left    Right Elbow Flexion 140    Right Elbow Extension 0    Left Elbow  Flexion 135    Left Elbow Extension 10    Right/Left Forearm Right;Left    Right Forearm Pronation 90 Degrees    Right Forearm Supination 70 Degrees    Left Forearm Pronation 80 Degrees    Left Forearm Supination 80 Degrees      Strength   Strength Assessment Site Elbow;Forearm    Right/Left Elbow Right;Left    Right Elbow Flexion 5/5    Right Elbow Extension 5/5    Left Elbow Flexion 5/5    Left Elbow Extension 5/5    Right/Left Forearm Right;Left    Right Forearm Pronation 5/5    Right Forearm Supination 5/5    Left Forearm Pronation 5/5      Hand Function   Right Hand Grip (lbs) 95   85 with elbow extended   Left Hand Grip (lbs) 93   78 with elbow extended                   OT Treatments/Exercises (OP) - 08/29/20 0001      Manual Therapy   Manual Therapy Myofascial release    Manual therapy comments manual therapy completed seperately from all other interventions this date.      Myofascial Release myofascial release and manual stretching to left volar and dorsal elbow region, proximal and distal to elbow joint with p/rom through full available range of elbow and forearm movement                  OT Education - 08/29/20 1116    Education Details educated on sustained stretch for left elbow flexion, supination, pronation  uisng 1-3 pound weight for elbow and hammer for forearm    Person(s) Educated Patient    Methods Explanation;Demonstration;Handout    Comprehension Verbalized understanding;Returned demonstration            OT Short Term Goals - 08/29/20 1541      OT SHORT TERM GOAL #1   Title Paitent will be educated and independent with HEP for use of LUE as WNL.    Time 3    Period Weeks    Status New    Target Date 09/19/20  OT SHORT TERM GOAL #2   Title Patient will improve BUE elbow and forearm A/ROM to WNL in order to reach overhead and push self out of a chair.    Time 3    Period Weeks    Status New      OT SHORT TERM GOAL #3    Title Patient will improve sustained activty tolerance in left elbow and forearm to WNL.    Time 3    Period Weeks    Status New      OT SHORT TERM GOAL #4   Title Patient will return to PLOF with all desired daily, work, leisure activities.    Time 3    Period Weeks    Status Revised                    Plan - 08/29/20 1536    Clinical Impression Statement A:  Patient is a 64 year old male with left closed displaced fracture of left radial head and right olecranon extra articular fracture on 07/07/20 after tripping on uneven sidewalk at work.  He is attempting to use his arms as much as possible.  His RUE is WFL, however, his LUE lacks full a/rom which causes weightbearing, full extnesion when reaching, and sustained activity tolreance difficult.    OT Occupational Profile and History Problem Focused Assessment - Including review of records relating to presenting problem    Occupational performance deficits (Please refer to evaluation for details): ADL's;IADL's;Work    Body Structure / Function / Physical Skills ADL;Endurance;Muscle spasms;UE functional use;Fascial restriction;Pain;Flexibility;ROM;Strength    Rehab Potential Excellent    Clinical Decision Making Limited treatment options, no task modification necessary    Comorbidities Affecting Occupational Performance: None    Modification or Assistance to Complete Evaluation  No modification of tasks or assist necessary to complete eval    OT Frequency 2x / week    OT Duration Other (comment)   for 1 week and then 1 x per week for 2 weeks   OT Treatment/Interventions Self-care/ADL training;Patient/family education;Passive range of motion;Cryotherapy;Splinting;Moist Heat;Therapeutic exercise;Manual Therapy;Therapeutic activities;Neuromuscular education    Plan P:  Skilled OT intervention to decrease pain and improve a/rom to WNL in order to return to PLOF with all desired activities.  Next session:  fabricate elbow extension  splint, manual therapy, a/rom and progress as tolerated.    OT Home Exercise Plan initial evaluation:  sustained stretch for supination/pronation, elbow extension.    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 [address] [place], [place], [address] Phone: [phone]   Fax:  [phone]  Name: Donald Cardenas MRN: [id number] Date of Birth: 07/22/1956

## 2020-09-01 ENCOUNTER — Ambulatory Visit (HOSPITAL_COMMUNITY): Payer: BC Managed Care – PPO | Attending: Orthopaedic Surgery

## 2020-09-01 ENCOUNTER — Encounter (HOSPITAL_COMMUNITY): Payer: Self-pay

## 2020-09-01 ENCOUNTER — Other Ambulatory Visit: Payer: Self-pay

## 2020-09-01 DIAGNOSIS — M25522 Pain in left elbow: Secondary | ICD-10-CM | POA: Insufficient documentation

## 2020-09-01 DIAGNOSIS — M25622 Stiffness of left elbow, not elsewhere classified: Secondary | ICD-10-CM | POA: Insufficient documentation

## 2020-09-01 DIAGNOSIS — M25521 Pain in right elbow: Secondary | ICD-10-CM | POA: Insufficient documentation

## 2020-09-01 NOTE — Patient Instructions (Signed)
Your Splint This splint should initially be fitted by a healthcare practitioner.  The healthcare practitioner is responsible for providing wearing instructions and precautions to the patient, other healthcare practitioners and care provider involved in the patient's care.  This splint was custom made for you. Please read the following instructions to learn about wearing and caring for your splint.  Precautions Should your splint cause any of the following problems, remove the splint immediately and contact your therapist/physician.  Swelling  Severe Pain  Pressure Areas  Stiffness  Numbness  Do not wear your splint while operating machinery unless it has been fabricated for that purpose.  When To Wear Your Splint Where your splint according to your therapist/physician instructions. Night time only.  Care and Cleaning of Your Splint 1. Keep your splint away from open flames. 2. Your splint will lose its shape in temperatures over 135 degrees Farenheit, ( in car windows, near radiators, ovens or in hot water).  Never make any adjustments to your splint, if the splint needs adjusting remove it and make an appointment to see your therapist. 3. Your splint, including the cushion liner may be cleaned with soap and lukewarm water.  Do not immerse in hot water over 135 degrees Farenheit. 4. Straps may be washed with soap and water, but do not moisten the self-adhesive portion. 5. For ink or hard to remove spots use a scouring cleanser which contains chlorine.  Rinse the splint thoroughly after using chlorine cleanser.  

## 2020-09-02 NOTE — Therapy (Signed)
[place] [facility] [address] [place], [place], [address] Phone: [phone]   Fax:  [phone]  Occupational Therapy Treatment  Patient Details  Name: Donald Cardenas MRN: [id number] Date of Birth: 1956-10-15 Referring Provider (OT): Dr. Darreld Mclean   Encounter Date: 09/01/2020   OT End of Session - 09/01/20 1735    Visit Number 2    Number of Visits 4    Date for OT Re-Evaluation 09/19/20    Authorization Type Workers Comp approved 4 visits    Authorization - Visit Number 2    Authorization - Number of Visits 4    OT Start Time 1430    OT Stop Time 1524    OT Time Calculation (min) 54 min    Activity Tolerance Patient tolerated treatment well    Behavior During Therapy [facility] for tasks assessed/performed           Past Medical History:  Diagnosis Date  . Anxiety 05/15/2019  . Borderline diabetes   . DM (diabetes mellitus) (HCC) 05/15/2019  . HTN (hypertension) 05/15/2019  . Hypertension     Past Surgical History:  Procedure Laterality Date  . VASECTOMY      There were no vitals filed for this visit.   Subjective Assessment - 09/01/20 1447    Subjective  S: This left elbow is what needs to straighten.    Currently in Pain? No/denies              [facility] OT Assessment - 09/01/20 1721      Assessment   Medical Diagnosis S/P Left closed displaced radial head fracture and right olecranon extra articular fracture       Precautions   Precautions None                    OT Treatments/Exercises (OP) - 09/01/20 1448      Exercises   Exercises Elbow      Elbow Exercises   Other elbow exercises Prolonged elbow extension stretch completed supine with use of moist heat and 2lb wrist weight while supine. 10'       Modalities   Modalities Moist Heat      Moist Heat Therapy   Number Minutes Moist Heat 5 Minutes    Moist Heat Location Elbow      Splinting   Splinting Left elbow extension splint fabricated for  nighttime use. Three 2 inch straps. And 6 velcro self adhesive pieces.                   OT Education - 09/01/20 1732    Education Details reviewed therapy goals. Discussed use of elbow extension splint, care, wearing schedule, precautions. patient able to demonstrate understanding of donning and doffing correctly.    Person(s) Educated Patient    Methods Explanation;Demonstration;Handout    Comprehension Verbalized understanding;Returned demonstration            OT Short Term Goals - 09/01/20 1438      OT SHORT TERM GOAL #1   Title Paitent will be educated and independent with HEP for use of LUE as WNL.    Time 3    Period Weeks    Status On-going    Target Date 09/19/20      OT SHORT TERM GOAL #2   Title Patient will improve BUE elbow and forearm A/ROM to WNL in order to reach overhead and push self out of a chair.    Time 3  Period Weeks    Status On-going      OT SHORT TERM GOAL #3   Title Patient will improve sustained activty tolerance in left elbow and forearm to WNL.    Time 3    Period Weeks    Status On-going      OT SHORT TERM GOAL #4   Title Patient will return to PLOF with all desired daily, work, leisure activities.    Time 3    Period Weeks    Status On-going                    Plan - 09/02/20 0944    Clinical Impression Statement A: Fabricated night time extension splint this session with education completed. Pt was able to tolerate prolonged extension stretch using moist heat and 2# wrist weight during session. Pt is not demonstrating 5 degrees of elbow extension which is an improvement from initial evaluation. VC were provided for ways to continue working on elbow extension in addition to HEP.    Body Structure / Function / Physical Skills ADL;Endurance;Muscle spasms;UE functional use;Fascial restriction;Pain;Flexibility;ROM;Strength    Plan P: Follow up on splint use. Manual therapy to left elbow region with passive stretching in  extension. Prolonged stretch with heat and 2# weight. Focus on increasing elbow extension.    Consulted and Agree with Plan of Care Patient           Patient will benefit from skilled therapeutic intervention in order to improve the following deficits and impairments:   Body Structure / Function / Physical Skills: ADL, Endurance, Muscle spasms, UE functional use, Fascial restriction, Pain, Flexibility, ROM, Strength       Visit Diagnosis: Stiffness of left elbow, not elsewhere classified  Pain in right elbow  Pain in left elbow    Problem List Patient Active Problem List   Diagnosis Date Noted  . 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 Essential hypertension 05/15/2019  . DM (diabetes mellitus) (HCC) 05/15/2019  . Anxiety 05/15/2019   Limmie Patricia, OTR/L,CBIS  [phone]  09/02/2020, 9:54 AM  [place] [facility] [address] [place], [place], [address] Phone: [phone]   Fax:  [phone]  Name: Donald Cardenas MRN: [id number] Date of Birth: [birth year]

## 2020-09-06 ENCOUNTER — Other Ambulatory Visit: Payer: Self-pay

## 2020-09-06 ENCOUNTER — Encounter: Payer: Self-pay | Admitting: Orthopaedic Surgery

## 2020-09-06 ENCOUNTER — Ambulatory Visit: Payer: BC Managed Care – PPO

## 2020-09-06 ENCOUNTER — Ambulatory Visit (INDEPENDENT_AMBULATORY_CARE_PROVIDER_SITE_OTHER): Payer: Worker's Compensation | Admitting: Orthopaedic Surgery

## 2020-09-06 VITALS — BP 153/76 | HR 67 | Ht 71.5 in | Wt 305.0 lb

## 2020-09-06 DIAGNOSIS — S52125D Nondisplaced fracture of head of left radius, subsequent encounter for closed fracture with routine healing: Secondary | ICD-10-CM | POA: Diagnosis not present

## 2020-09-06 DIAGNOSIS — S52021A Displaced fracture of olecranon process without intraarticular extension of right ulna, initial encounter for closed fracture: Secondary | ICD-10-CM

## 2020-09-06 DIAGNOSIS — S52122A Displaced fracture of head of left radius, initial encounter for closed fracture: Secondary | ICD-10-CM

## 2020-09-06 NOTE — Patient Instructions (Signed)
Continue light duty at work until follow up

## 2020-09-06 NOTE — Progress Notes (Signed)
My arms are better  He has been going to OT and is making good progress.  His left arm lacks about 8 degrees of full extension at the elbow.  I want him to got to 0 to 5 degrees lack of full extension.  Continue OT.  His right elbow is just slightly tender.  He says he might want the loose body removed.  He will think about it.  X-rays were done of both elbows, reported separately.  Encounter Diagnoses  Name Primary?  . Closed nondisplaced fracture of head of left radius with routine healing, subsequent encounter Yes  . Closed extra-articular fracture of olecranon, right, initial encounter    Continue OT.  Return in three weeks.  X-rays of the left elbow on return.  Call if any problem.  Precautions discussed.   Electronically Signed Darreld Mclean, MD 10/12/20219:46 AM

## 2020-09-07 ENCOUNTER — Ambulatory Visit (HOSPITAL_COMMUNITY): Payer: BC Managed Care – PPO

## 2020-09-07 ENCOUNTER — Encounter (HOSPITAL_COMMUNITY): Payer: Self-pay

## 2020-09-07 DIAGNOSIS — M25622 Stiffness of left elbow, not elsewhere classified: Secondary | ICD-10-CM | POA: Diagnosis not present

## 2020-09-07 DIAGNOSIS — M25522 Pain in left elbow: Secondary | ICD-10-CM

## 2020-09-07 DIAGNOSIS — M25521 Pain in right elbow: Secondary | ICD-10-CM

## 2020-09-07 NOTE — Therapy (Addendum)
[place] [facility] [address] [place], [place], [address] Phone: [phone]   Fax:  [phone]  Occupational Therapy Treatment  Patient Details  Name: Donald Cardenas MRN: [id number] Date of Birth: Aug 05, 1956 Referring Provider (OT): Dr. Darreld Mclean   Encounter Date: 09/07/2020   OT End of Session - 09/07/20 1631    Visit Number 3    Number of Visits 4    Date for OT Re-Evaluation 09/19/20    Authorization Type Workers Comp approved 4 visits    Authorization - Visit Number 3    Authorization - Number of Visits 4    OT Start Time 1515    OT Stop Time 1557    OT Time Calculation (min) 42 min    Activity Tolerance Patient tolerated treatment well    Behavior During Therapy [facility] for tasks assessed/performed           Past Medical History:  Diagnosis Date  . Anxiety 05/15/2019  . Borderline diabetes   . DM (diabetes mellitus) (HCC) 05/15/2019  . HTN (hypertension) 05/15/2019  . Hypertension     Past Surgical History:  Procedure Laterality Date  . VASECTOMY      There were no vitals filed for this visit.   Subjective Assessment - 09/07/20 1518    Subjective  S: I'm doing so much better    Currently in Pain? No/denies              [facility] OT Assessment - 09/07/20 1548      Assessment   Medical Diagnosis S/P Left closed displaced radial head fracture and right olecranon extra articular fracture       Precautions   Precautions None                    OT Treatments/Exercises (OP) - 09/07/20 1518      Exercises   Exercises Elbow      Elbow Exercises   Other elbow exercises Prolonged elbow extension stretch completed supine with use of moist heat and 2lb wrist weight while supine. 10'       Functional Reaching Activities   Mid Level While seated at table, using LUE to pick up clothespin bring to tap shoulder then extension of elbow to clip pin on horizontal bar. 20 clips. Then repeat elbow extension to reach  for and remove clothespins then tap shoulder.       Modalities   Modalities Moist Heat      Moist Heat Therapy   Number Minutes Moist Heat 10 Minutes    Moist Heat Location Elbow      Manual Therapy   Manual Therapy Myofascial release    Manual therapy comments manual therapy completed seperately from all other interventions this date.      Myofascial Release myofascial release and manual stretching to left volar and dorsal elbow region, proximal and distal to elbow joint with p/rom through full available range of elbow and forearm movement                     OT Short Term Goals - 09/01/20 1438      OT SHORT TERM GOAL #1   Title Paitent will be educated and independent with HEP for use of LUE as WNL.    Time 3    Period Weeks    Status On-going    Target Date 09/19/20      OT SHORT TERM GOAL #2   Title  Patient will improve BUE elbow and forearm A/ROM to WNL in order to reach overhead and push self out of a chair.    Time 3    Period Weeks    Status On-going      OT SHORT TERM GOAL #3   Title Patient will improve sustained activty tolerance in left elbow and forearm to WNL.    Time 3    Period Weeks    Status On-going      OT SHORT TERM GOAL #4   Title Patient will return to PLOF with all desired daily, work, leisure activities.    Time 3    Period Weeks    Status On-going                    Plan - 09/07/20 1627    Clinical Impression Statement A: Continued prolonged elbow extension stretch with moist heat and 2# for increased mobility benefit. Patient then completed clothespin activity to promote further extension of elbow and tapping at shoulder, with VC required to extend the elbow as much as possible throughout each reach.    Occupational performance deficits (Please refer to evaluation for details): --    Body Structure / Function / Physical Skills ADL;Endurance;Muscle spasms;UE functional use;Fascial restriction;Pain;Flexibility;ROM;Strength     Plan P: Reassess and discharge with any applicable additions to HEP. Continue manual techniques and moist heat with prolonged extension stretch and 2# wrist weight, complete elbow flexion and overhead press with focus on full extension    Consulted and Agree with Plan of Care Patient           Patient will benefit from skilled therapeutic intervention in order to improve the following deficits and impairments:   Body Structure / Function / Physical Skills: ADL, Endurance, Muscle spasms, UE functional use, Fascial restriction, Pain, Flexibility, ROM, Strength       Visit Diagnosis: Stiffness of left elbow, not elsewhere classified  Pain in right elbow  Pain in left elbow    Problem List Patient Active Problem List   Diagnosis Date Noted  . 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 Essential hypertension 05/15/2019  . DM (diabetes mellitus) (HCC) 05/15/2019  . Anxiety 05/15/2019  . Sleep apnea 01/03/2015  . Family history of kidney disease in father 06/27/2013    Donald Cardenas, [place] Student [phone]   09/08/2020, 3:28 PM  [place] [address] [place], [place], [address] Phone: [phone]   Fax:  [phone]  Name: Donald Cardenas MRN: [id number] Date of Birth: 1956-11-11

## 2020-09-14 ENCOUNTER — Ambulatory Visit (HOSPITAL_COMMUNITY): Payer: BC Managed Care – PPO

## 2020-09-14 ENCOUNTER — Encounter (HOSPITAL_COMMUNITY): Payer: Self-pay

## 2020-09-14 ENCOUNTER — Other Ambulatory Visit: Payer: Self-pay

## 2020-09-14 DIAGNOSIS — M25622 Stiffness of left elbow, not elsewhere classified: Secondary | ICD-10-CM | POA: Diagnosis not present

## 2020-09-14 DIAGNOSIS — M25521 Pain in right elbow: Secondary | ICD-10-CM

## 2020-09-14 DIAGNOSIS — M25522 Pain in left elbow: Secondary | ICD-10-CM

## 2020-09-14 NOTE — Therapy (Addendum)
[place], [place], [address] Phone: [phone]   Fax:  [phone]  Occupational Therapy Treatment Reassessment/discharge summary Patient Details  Name: Donald Cardenas MRN: [id number] Date of Birth: [birth year] Referring Provider (OT): Dr. Sanjuana Kava   Encounter Date: 09/14/2020   OT End of Session - 09/14/20 1512    Visit Number 4    Number of Visits 4        Authorization Type Workers [employer] approved 4 visits    Authorization - Visit Number 4    Authorization - Number of Visits 4    OT Start Time 1503    OT Stop Time 1552    OT Time Calculation (min) 49 min    Activity Tolerance Patient tolerated treatment well    Behavior During Therapy [facility] for tasks assessed/performed           Past Medical History:  Diagnosis Date  . Anxiety 05/15/2019  . Borderline diabetes   . DM (diabetes mellitus) ([place]) 05/15/2019  . HTN (hypertension) 05/15/2019  . Hypertension     Past Surgical History:  Procedure Laterality Date  . VASECTOMY      There were no vitals filed for this visit.   Subjective Assessment - 09/14/20 1505    Subjective  S: Today's it right?    Currently in Pain? No/denies              [facility] OT Assessment - 09/14/20 1506      Assessment   Medical Diagnosis S/P Left closed displaced radial head fracture and right olecranon extra articular fracture     Referring Provider (OT) Dr. Sanjuana Kava      Precautions   Precautions None      ROM / Strength   AROM / PROM / Strength AROM      AROM   Overall AROM Comments assessed seated    AROM Assessment Site Elbow    Right/Left Elbow Left;Right    Left Elbow Flexion 135   previous 135   Left Elbow Extension 4   previous 10   Right Forearm Supination 80 Degrees   previous 70   Left Forearm Pronation 88 Degrees   previous 80   Left Forearm Supination 82 Degrees   previously 80                   OT Treatments/Exercises (OP) -  09/14/20 1506      Exercises   Exercises Elbow      Elbow Exercises   Other elbow exercises Prolonged elbow extension stretch completed supine with use of moist heat and 2lb wrist weight while supine. 10'     Other elbow exercises Completed elbow flexion to overhead press, with focus on full elbow extension. 1# free weight, 10X      Modalities   Modalities Moist Heat      Moist Heat Therapy   Number Minutes Moist Heat 10 Minutes    Moist Heat Location Elbow      Manual Therapy   Manual Therapy Myofascial release    Manual therapy comments manual therapy completed seperately from all other interventions this date.      Myofascial Release myofascial release and manual stretching to left volar and dorsal elbow region, proximal and distal to elbow joint with p/rom through full available range of elbow and forearm movement                   OT  Education - 09/14/20 1603    Education Details Reviewed goals, elbow flexion with overhead press with focus on full elbow extension provided as HEP    Person(s) Educated Patient    Methods Explanation;Handout;Demonstration    Comprehension Verbalized understanding;Returned demonstration;Verbal cues required            OT Short Term Goals - 09/14/20 1541      OT SHORT TERM GOAL #1   Title Paitent will be educated and independent with HEP for use of LUE as WNL.    Time 3    Period Weeks    Status Achieved    Target Date 09/19/20      OT SHORT TERM GOAL #2   Title Patient will improve BUE elbow and forearm A/ROM to WNL in order to reach overhead and push self out of a chair.    Time 3    Period Weeks    Status Achieved      OT SHORT TERM GOAL #3   Title Patient will improve sustained activty tolerance in left elbow and forearm to WNL.    Time 3    Period Weeks    Status Achieved      OT SHORT TERM GOAL #4   Title Patient will return to PLOF with all desired daily, work, leisure activities.    Time 3    Period Weeks     Status Achieved                    Plan - 09/14/20 1513    Clinical Impression Statement A: Moist heat and 2# wrist weight completed to promote prolonged elbow extension stretch. Reassessment completed with patient meeting all goals, and demonstrating progress with available elbow extension to WNL. Patient reports that he is satisfied with therapy and functional outcomes, has improved his ability to participate daily in desired occupations and is in agreement with discharging from therapy. Patient no longer needs skilled OT intervention at this time.    Body Structure / Function / Physical Skills ADL;Endurance;Muscle spasms;UE functional use;Fascial restriction;Pain;Flexibility;ROM;Strength    Plan P: Discharge with HEP provided    OT Home Exercise Plan initial evaluation:  sustained stretch for supination/pronation, elbow extension. 10/20: elbow flexion using dumbbell and overhead press for promoting full elbow extension    Consulted and Agree with Plan of Care Patient           Patient will benefit from skilled therapeutic intervention in order to improve the following deficits and impairments:   Body Structure / Function / Physical Skills: ADL, Endurance, Muscle spasms, UE functional use, Fascial restriction, Pain, Flexibility, ROM, Strength       Visit Diagnosis: Stiffness of left elbow, not elsewhere classified  Pain in right elbow  Pain in left elbow    Problem List Patient Active Problem List   Diagnosis Date Noted  . 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 Essential hypertension 05/15/2019  . DM (diabetes mellitus) ([place]) 05/15/2019  . Anxiety 05/15/2019  . Sleep apnea 01/03/2015  . Family history of kidney disease in father 06/27/2013    OCCUPATIONAL THERAPY DISCHARGE SUMMARY  Visits from Start of Care: 4  Current functional level related to goals / functional outcomes: See above   Remaining deficits: See above   Education / Equipment: See  above Plan: Patient agrees to discharge.  Patient goals were met. Patient is being discharged due to meeting the stated rehab goals.  ?????         Taurus Alamo  [place], [place] Student [phone]   09/14/2020, 4:08 PM  [place], [place], [address] Phone: [phone]   Fax:  [phone]  Name: Donald Cardenas MRN: [id number] Date of Birth: 03-16-1956

## 2020-09-14 NOTE — Patient Instructions (Signed)
Hammer Curl to Shoulder Press (complete one arm at a time, start with 10 reps then progress to 12-15 reps as you feel stronger) Complete at least once a day.  In a seated position or standing, hold one dumbbell at your side, with arm extended. Sit/stand tall with your feet shoulder-width apart.  Lead with your thumb, curl the dumbbell to meet your shoulder.  Press the dumbbells straight overhead retaining the neutral hand position.  Lower the dumbbell down to your shoulder, and lower it back to the start position.  *Be sure to brace your core and keep your spine flat throughout the entire movement.

## 2020-09-27 ENCOUNTER — Encounter: Payer: Self-pay | Admitting: Orthopaedic Surgery

## 2020-09-27 ENCOUNTER — Ambulatory Visit (INDEPENDENT_AMBULATORY_CARE_PROVIDER_SITE_OTHER): Payer: Worker's Compensation | Admitting: Orthopaedic Surgery

## 2020-09-27 ENCOUNTER — Ambulatory Visit: Payer: Worker's Compensation

## 2020-09-27 ENCOUNTER — Other Ambulatory Visit: Payer: Self-pay

## 2020-09-27 DIAGNOSIS — S52122D Displaced fracture of head of left radius, subsequent encounter for closed fracture with routine healing: Secondary | ICD-10-CM

## 2020-09-27 DIAGNOSIS — S52021D Displaced fracture of olecranon process without intraarticular extension of right ulna, subsequent encounter for closed fracture with routine healing: Secondary | ICD-10-CM

## 2020-09-27 NOTE — Progress Notes (Signed)
I have no pain.  He has full ROM of both elbows and no pain.  NV intact.  He is concerned about the bony fragment in the right upper arm.  I told him it should not be a problem.    X-rays were done of the left elbow, reported separately.  Encounter Diagnoses  Name Primary?  . Closed displaced fracture of head of left radius with routine healing, subsequent encounter Yes  . Closed extra-articular fracture of olecranon, right, with routine healing, subsequent encounter    I will discharge today.  He is at maximum medical benefit.  I do not anticipate any permanent impairment of either elbow.  Call if any problem.  Precautions discussed.   Electronically Signed Darreld Mclean, MD 11/2/20212:24 PM

## 2020-09-29 ENCOUNTER — Other Ambulatory Visit: Payer: Self-pay | Admitting: Cardiology

## 2020-09-29 DIAGNOSIS — I493 Ventricular premature depolarization: Secondary | ICD-10-CM

## 2020-12-28 ENCOUNTER — Other Ambulatory Visit: Payer: Self-pay

## 2020-12-28 ENCOUNTER — Inpatient Hospital Stay: Payer: BC Managed Care – PPO

## 2020-12-28 ENCOUNTER — Ambulatory Visit: Payer: BC Managed Care – PPO | Admitting: Cardiology

## 2020-12-28 ENCOUNTER — Encounter: Payer: Self-pay | Admitting: Cardiology

## 2020-12-28 VITALS — BP 155/84 | HR 73 | Temp 97.7°F | Resp 14 | Ht 71.5 in | Wt 306.0 lb

## 2020-12-28 DIAGNOSIS — I493 Ventricular premature depolarization: Secondary | ICD-10-CM

## 2020-12-28 DIAGNOSIS — E782 Mixed hyperlipidemia: Secondary | ICD-10-CM

## 2020-12-28 DIAGNOSIS — R0789 Other chest pain: Secondary | ICD-10-CM | POA: Insufficient documentation

## 2020-12-28 DIAGNOSIS — I10 Essential (primary) hypertension: Secondary | ICD-10-CM

## 2020-12-28 MED ORDER — ROSUVASTATIN CALCIUM 10 MG PO TABS: 10.0000 mg | ORAL_TABLET | Freq: Every day | ORAL | 3 refills | Status: DC

## 2020-12-28 MED ORDER — DILTIAZEM HCL ER BEADS 240 MG PO CP24
240.0000 mg | ORAL_CAPSULE | Freq: Every day | ORAL | 3 refills | Status: DC
Start: 2020-12-28 — End: 2021-02-02

## 2020-12-28 NOTE — Progress Notes (Signed)
Patient referred by Aura Dials, PA-C for irregular heart rate  Subjective:   Donald Cardenas, male    DOB: October 06, 1956, 65 y.o.   MRN: [id number]  Chief complaint:  Symptomatic PVC's  HPI  65 y.o. Caucasian male with hypertension, type 2 DM, with symptomatic PVCs  Patient was last seen by me in 03/2020. Patient recently had episodes of palpitations, associated with dizziness and neck pain; He denies any exertional chest pain. He continues to have a lot stress related to work.   He was recently started on HCTZ 12.5 mg daily by PCP.   Reviewed recent labs with the patient, details below.   Current Outpatient Medications on File Prior to Visit  Medication Sig Dispense Refill  . aspirin EC 81 MG tablet Take 81 mg by mouth daily.    . baclofen (LIORESAL) 10 MG tablet Take 1 tablet by mouth daily.    . cetirizine (ZYRTEC) 10 MG tablet Take 10 mg by mouth daily.    Marland Kitchen diltiazem (TIAZAC) 120 MG 24 hr capsule Take 1 capsule by mouth daily.    . furosemide (LASIX) 20 MG tablet Take 20 mg by mouth as directed. 2 in the morning, 1 at lunch    . potassium chloride (K-DUR) 10 MEQ tablet Take 10 mEq by mouth 2 (two) times a day.    . sildenafil (VIAGRA) 100 MG tablet Take 100 mg by mouth daily as needed for erectile dysfunction.    Marland Kitchen spironolactone (ALDACTONE) 50 MG tablet Take 50 mg by mouth daily.     No current facility-administered medications on file prior to visit.    Cardiovascular studies:  EKG 12/28/2020: Sinus rhythm 65 bpm  Left anterior fascicular block  Echocardiogram 06/25/2019 :  Normal LV systolic function with EF 55%. Left ventricle cavity is normal in size. Moderate concentric hypertrophy of the left ventricle. Normal global wall motion. Normal diastolic filling pattern. Frequent PVC noted during exam. Calculated EF 55%. Left atrial cavity is moderately dilated at 4.7 cm. Structurally normal appearing tricuspid valve. Mild tricuspid regurgitation. No evidence of  pulmonary hypertension.   Recent labs: 11/11/2020: HbA1C 6.5%  Aug-Sep 2021: Glucose 144, BUN/Cr 23/1.24. eGFR 61, Na/K 139/4.5 H/H 16/47, MCV 79, platelets 201 Chol 169, TG 124, HDL 41, LDL 106  05/06/2019: Glucose 91. BUN/Cr 18/1.16 eGFR 67. Na/K 144/4.6.  Chol 144, TG 57, HDL 41, LDL 92.  HbA1C 5.7%   Review of Systems  Cardiovascular: Positive for palpitations (Occasional, short lasting). Negative for chest pain, dyspnea on exertion, leg swelling and syncope.         Vitals:   12/28/20 1117  BP: (!) 155/84  Pulse: 73  Resp: 14  Temp: 97.7 F (36.5 C)  SpO2: 96%     Body mass index is 42.08 kg/m. Filed Weights   12/28/20 1117  Weight: (!) 306 lb (138.8 kg)     Objective:   Physical Exam Vitals and nursing note reviewed.  Constitutional:      General: He is not in acute distress. Neck:     Vascular: No JVD.  Cardiovascular:     Rate and Rhythm: Normal rate and regular rhythm.     Pulses: Intact distal pulses.     Heart sounds: Normal heart sounds. No murmur heard.   Pulmonary:     Effort: Pulmonary effort is normal.     Breath sounds: Normal breath sounds. No wheezing or rales.         Assessment & Recommendations:  65 y.o. Caucasian male with hypertension, type 2 DM, with symptomatic PVCs  Symptomatic PVCs: Increased diltiazem 240 mg daily.  Placed on cardiac telemetry  Hypertension: Uncontrolled. Continue HCTZ, increased diltiazem to 240 mg daily.  Mixed hyperlipidemia: Started Crestor 10 mg. Will check calcium score  F/u in 4-6 weeks  Rayaan Lorah Esther Hardy, MD [facility] [place] Pager: [phone] Office: [phone] If no answer Cell [phone]

## 2021-01-16 NOTE — Telephone Encounter (Signed)
Is the question regarding CT cardiac scoring (calcium score scan)? Then answer is no.  Thanks MJP

## 2021-01-18 ENCOUNTER — Ambulatory Visit (HOSPITAL_COMMUNITY)
Admission: RE | Admit: 2021-01-18 | Discharge: 2021-01-18 | Disposition: A | Discharge status: Home / Self Care | Payer: Self-pay | Source: Ambulatory Visit | Attending: Physician Assistant | Admitting: Physician Assistant

## 2021-01-18 ENCOUNTER — Other Ambulatory Visit: Payer: Self-pay

## 2021-01-18 DIAGNOSIS — E782 Mixed hyperlipidemia: Secondary | ICD-10-CM | POA: Insufficient documentation

## 2021-01-18 DIAGNOSIS — I10 Essential (primary) hypertension: Secondary | ICD-10-CM | POA: Insufficient documentation

## 2021-01-30 ENCOUNTER — Ambulatory Visit: Payer: BC Managed Care – PPO | Admitting: Cardiology

## 2021-02-02 ENCOUNTER — Ambulatory Visit: Payer: BC Managed Care – PPO | Admitting: Cardiology

## 2021-02-02 ENCOUNTER — Encounter: Payer: Self-pay | Admitting: Cardiology

## 2021-02-02 ENCOUNTER — Other Ambulatory Visit: Payer: Self-pay

## 2021-02-02 VITALS — BP 130/77 | HR 64 | Temp 98.4°F | Resp 16 | Ht 71.5 in | Wt 310.0 lb

## 2021-02-02 DIAGNOSIS — I10 Essential (primary) hypertension: Secondary | ICD-10-CM

## 2021-02-02 DIAGNOSIS — R931 Abnormal findings on diagnostic imaging of heart and coronary circulation: Secondary | ICD-10-CM

## 2021-02-02 DIAGNOSIS — I493 Ventricular premature depolarization: Secondary | ICD-10-CM

## 2021-02-02 DIAGNOSIS — E782 Mixed hyperlipidemia: Secondary | ICD-10-CM

## 2021-02-02 MED ORDER — SPIRONOLACTONE 50 MG PO TABS: 50.0000 mg | ORAL_TABLET | Freq: Every day | ORAL | 3 refills | Status: AC

## 2021-02-02 MED ORDER — ASPIRIN EC 81 MG PO TBEC: 81.0000 mg | DELAYED_RELEASE_TABLET | Freq: Every day | ORAL | 3 refills | Status: DC

## 2021-02-02 MED ORDER — ATORVASTATIN CALCIUM 40 MG PO TABS: 20.0000 mg | ORAL_TABLET | Freq: Every day | ORAL | 3 refills | Status: DC

## 2021-02-02 MED ORDER — DILTIAZEM HCL ER COATED BEADS 120 MG PO CP24: 120.0000 mg | ORAL_CAPSULE | Freq: Every day | ORAL | 3 refills | Status: AC

## 2021-02-02 NOTE — Progress Notes (Signed)
Patient referred by Aura Dials, PA-C for irregular heart rate  Subjective:   Donald Cardenas, male    DOB: Apr 11, 1956, 65 y.o.   MRN: [id number]  Chief complaint:  Symptomatic PVC's  HPI  65 y.o. Caucasian male with hypertension, type 2 DM, with symptomatic PVCs  Patient reports that he has had feelings of jitteriness with rosuvastatin. Otherwise, he is doing well. He does not have any significant palpitations.    Current Outpatient Medications on File Prior to Visit  Medication Sig Dispense Refill  . aspirin EC 81 MG tablet Take 81 mg by mouth daily.    . baclofen (LIORESAL) 10 MG tablet Take 1 tablet by mouth daily.    . cetirizine (ZYRTEC) 10 MG tablet Take 10 mg by mouth daily.    Marland Kitchen diltiazem (TIAZAC) 240 MG 24 hr capsule Take 1 capsule (240 mg total) by mouth daily. 90 capsule 3  . furosemide (LASIX) 20 MG tablet Take 20 mg by mouth as directed. 2 in the morning, 1 at lunch    . hydrochlorothiazide (MICROZIDE) 12.5 MG capsule Take 12.5 mg by mouth daily.    . potassium chloride (K-DUR) 10 MEQ tablet Take 10 mEq by mouth 2 (two) times a day.    . rosuvastatin (CRESTOR) 10 MG tablet Take 1 tablet (10 mg total) by mouth daily. 30 tablet 3  . sildenafil (VIAGRA) 100 MG tablet Take 100 mg by mouth daily as needed for erectile dysfunction.    Marland Kitchen spironolactone (ALDACTONE) 50 MG tablet Take 50 mg by mouth daily.     No current facility-administered medications on file prior to visit.    Cardiovascular studies:  Mobile cardiac telemetry 9 days 12/28/2020 - 01/06/2021: Dominant rhythm: Sinus w/IVCD. HR 45-67 bpm. Avg HR 134 bpm, while in sinus rhythm. 17 episodes of SVT/atrial tachycardia, fastest at 152 bpm for 6 beats, longest for 13 beats at 100 bpm. 1.3% isolated SVE, <1 couplet/triplets. 1.4% isolated VE, <1 couplet/triplets. No atrial fibrillation/atrial flutter/VT/high grade AV block, sinus pause >3sec noted. 0 patient triggered events.    CT Cardiac scoring  01/18/2021; Ascending Aorta: 38 mm at the mid ascending aorta measured in an axial plane. Coronary arteries: LM: 0 LAD: 15 LCx: 0 RCA: 13 Total coronary calcium score of 28. This was 39th percentile for age and sex matched control.   EKG 12/28/2020: Sinus rhythm 65 bpm  Left anterior fascicular block  Echocardiogram 06/25/2019 :  Normal LV systolic function with EF 55%. Left ventricle cavity is normal in size. Moderate concentric hypertrophy of the left ventricle. Normal global wall motion. Normal diastolic filling pattern. Frequent PVC noted during exam. Calculated EF 55%. Left atrial cavity is moderately dilated at 4.7 cm. Structurally normal appearing tricuspid valve. Mild tricuspid regurgitation. No evidence of pulmonary hypertension.   Recent labs: 11/11/2020: HbA1C 6.5%  Aug-Sep 2021: Glucose 144, BUN/Cr 23/1.24. eGFR 61, Na/K 139/4.5 H/H 16/47, MCV 79, platelets 201 Chol 169, TG 124, HDL 41, LDL 106  05/06/2019: Glucose 91. BUN/Cr 18/1.16 eGFR 67. Na/K 144/4.6.  Chol 144, TG 57, HDL 41, LDL 92.  HbA1C 5.7%   Review of Systems  Cardiovascular: Positive for palpitations (Occasional, short lasting). Negative for chest pain, dyspnea on exertion, leg swelling and syncope.        Vitals:   02/02/21 1055 02/02/21 1058  BP: (!) 159/76 130/77  Pulse: 67 64  Resp: 16   Temp: 98.4 F (36.9 C)   SpO2: 98% 95%  Body mass index is 42.63 kg/m. Filed Weights   02/02/21 1055  Weight: (!) 310 lb (140.6 kg)     Objective:   Physical Exam Vitals and nursing note reviewed.  Constitutional:      General: He is not in acute distress. Neck:     Vascular: No JVD.  Cardiovascular:     Rate and Rhythm: Normal rate and regular rhythm.     Pulses: Intact distal pulses.     Heart sounds: Normal heart sounds. No murmur heard.   Pulmonary:     Effort: Pulmonary effort is normal.     Breath sounds: Normal breath sounds. No wheezing or rales.          Assessment & Recommendations:   65 y.o. Caucasian male with hypertension, type 2 DM, with symptomatic PVCs  Symptomatic PVCs: Well controlled on diltiazem 120 mg daily.   Hypertension: Fairly well controlled. To streamline therapy, I have stopped his HCTZ and K. Continue spironolactone, diltiazem. If BP increases, could increase diltiazem from 120 mg to 240 mg.  Mixed hyperlipidemia, elevated calcium score: Switched to Lipitor 40 mg daily. Repeat lipid panel in 3 months  F/u in 3 months  [place] Esther Hardy, MD [facility] Pager: [phone] Office: [phone] If no answer Cell [phone]

## 2021-05-08 ENCOUNTER — Ambulatory Visit: Payer: BC Managed Care – PPO | Admitting: Cardiology

## 2021-05-24 ENCOUNTER — Other Ambulatory Visit: Payer: Self-pay

## 2021-05-24 ENCOUNTER — Ambulatory Visit: Payer: BC Managed Care – PPO | Admitting: Cardiology

## 2021-05-24 ENCOUNTER — Encounter: Payer: Self-pay | Admitting: Cardiology

## 2021-05-24 VITALS — BP 129/78 | HR 70 | Temp 97.2°F | Resp 17 | Ht 71.5 in | Wt 304.8 lb

## 2021-05-24 DIAGNOSIS — E782 Mixed hyperlipidemia: Secondary | ICD-10-CM

## 2021-05-24 DIAGNOSIS — I493 Ventricular premature depolarization: Secondary | ICD-10-CM

## 2021-05-24 DIAGNOSIS — R931 Abnormal findings on diagnostic imaging of heart and coronary circulation: Secondary | ICD-10-CM

## 2021-05-24 DIAGNOSIS — I10 Essential (primary) hypertension: Secondary | ICD-10-CM

## 2021-05-24 NOTE — Progress Notes (Signed)
Patient referred by Aura Dials, PA-C for irregular heart rate  Subjective:   Donald Cardenas, male    DOB: 1956-03-31, 65 y.o.   MRN: [id number]  Chief complaint:  Symptomatic PVC's  HPI  65 y.o. Caucasian male with hypertension, type 2 DM, with symptomatic PVCs  Patient is doing well, denies chest pain, shortness of breath, palpitations, leg edema, orthopnea, PND, TIA/syncope. Reviewed recent lab results with the patient, details below.    Current Outpatient Medications on File Prior to Visit  Medication Sig Dispense Refill   aspirin EC 81 MG tablet Take 1 tablet (81 mg total) by mouth daily. 90 tablet 3   atorvastatin (LIPITOR) 40 MG tablet Take 0.5 tablets (20 mg total) by mouth daily. 90 tablet 3   cetirizine (ZYRTEC) 10 MG tablet Take 10 mg by mouth daily.     diltiazem (CARDIZEM CD) 120 MG 24 hr capsule Take 1 capsule (120 mg total) by mouth daily. 90 capsule 3   furosemide (LASIX) 20 MG tablet Take 20 mg by mouth as directed. 2 in the morning, 1 at lunch     sildenafil (VIAGRA) 100 MG tablet Take 100 mg by mouth daily as needed for erectile dysfunction.     spironolactone (ALDACTONE) 50 MG tablet Take 1 tablet (50 mg total) by mouth daily. 90 tablet 3   No current facility-administered medications on file prior to visit.    Cardiovascular studies:  Mobile cardiac telemetry 9 days 12/28/2020 - 01/06/2021: Dominant rhythm: Sinus w/IVCD. HR 45-67 bpm. Avg HR 134 bpm, while in sinus rhythm. 17 episodes of SVT/atrial tachycardia, fastest at 152 bpm for 6 beats, longest for 13 beats at 100 bpm. 1.3% isolated SVE, <1 couplet/triplets. 1.4% isolated VE, <1 couplet/triplets. No atrial fibrillation/atrial flutter/VT/high grade AV block, sinus pause >3sec noted. 0 patient triggered events.    CT Cardiac scoring 01/18/2021; Ascending Aorta: 38 mm at the mid ascending aorta measured in an axial plane. Coronary arteries: LM: 0 LAD: 15 LCx: 0 RCA: 13  Total coronary  calcium score of 28. This was 39th percentile for age and sex matched control.    EKG 12/28/2020: Sinus rhythm 65 bpm  Left anterior fascicular block  Echocardiogram 06/25/2019 :  Normal LV systolic function with EF 55%. Left ventricle cavity is normal in size. Moderate concentric hypertrophy of the left ventricle. Normal global wall motion. Normal diastolic filling pattern. Frequent PVC noted during exam. Calculated EF 55%. Left atrial cavity is moderately dilated at 4.7 cm. Structurally normal appearing tricuspid valve. Mild tricuspid regurgitation. No evidence of pulmonary hypertension.   Recent labs: 05/19/2021: HbA1C 7.2%  02/10/2021: Glucose 142, BUN/Cr 20/1.18. EGFR 69. Na/K 139/4.5. Rest of the CMP normal H/H 15/46. MCV 80. Platelets 182 HbA1C 7.1% Chol 135, TG 93, HDL 36, LDL 81 TSH N/A  11/11/2020: HbA1C 6.5%  Aug-Sep 2021: Glucose 144, BUN/Cr 23/1.24. eGFR 61, Na/K 139/4.5 H/H 16/47, MCV 79, platelets 201 Chol 169, TG 124, HDL 41, LDL 106  05/06/2019: Glucose 91. BUN/Cr 18/1.16 eGFR 67. Na/K 144/4.6.  Chol 144, TG 57, HDL 41, LDL 92.  HbA1C 5.7%   Review of Systems  Cardiovascular:  Negative for chest pain, dyspnea on exertion, leg swelling, palpitations and syncope.       Vitals:   05/24/21 1411 05/24/21 1420  BP: (!) 157/84 129/78  Pulse: 79 70  Resp: 17   Temp: (!) 97.2 F (36.2 C)   SpO2: 98% 98%    Body mass index is 41.92  kg/m. Filed Weights   05/24/21 1411  Weight: (!) 304 lb 12.8 oz (138.3 kg)     Objective:   Physical Exam Vitals and nursing note reviewed.  Constitutional:      General: He is not in acute distress. Neck:     Vascular: No JVD.  Cardiovascular:     Rate and Rhythm: Normal rate and regular rhythm.     Heart sounds: Normal heart sounds. No murmur heard. Pulmonary:     Effort: Pulmonary effort is normal.     Breath sounds: Normal breath sounds. No wheezing or rales.  Musculoskeletal:     Right lower leg: No edema.      Left lower leg: No edema.        Assessment & Recommendations:   65 y.o. Caucasian male with hypertension, type 2 DM, with symptomatic PVCs  Symptomatic PVCs: Well controlled on diltiazem 120 mg daily.    Hypertension: Fairly well controlled.  Mixed hyperlipidemia, elevated calcium score: HDL 36, LDL 81 on Lipitor 40 mg daily. With A1C also at 7.2%, I strongly recommend weight loss. Weight loss could help with all these nu,bers.  No change made to meds today. Repeat fasting lipid panel in 6 months  F/u in 6 months  [place], MD [facility] [place] Pager: [phone] Office: [phone] If no answer Cell [phone]

## 2021-11-23 ENCOUNTER — Ambulatory Visit: Payer: BC Managed Care – PPO | Admitting: Cardiology

## 2021-12-18 ENCOUNTER — Ambulatory Visit: Payer: BC Managed Care – PPO | Admitting: Cardiology

## 2021-12-18 ENCOUNTER — Encounter: Payer: Self-pay | Admitting: Cardiology

## 2021-12-18 ENCOUNTER — Other Ambulatory Visit: Payer: Self-pay

## 2021-12-18 VITALS — BP 135/72 | HR 56 | Temp 98.0°F | Resp 16 | Ht 71.0 in | Wt 312.0 lb

## 2021-12-18 DIAGNOSIS — I10 Essential (primary) hypertension: Secondary | ICD-10-CM

## 2021-12-18 DIAGNOSIS — I493 Ventricular premature depolarization: Secondary | ICD-10-CM

## 2021-12-18 DIAGNOSIS — R931 Abnormal findings on diagnostic imaging of heart and coronary circulation: Secondary | ICD-10-CM

## 2021-12-18 DIAGNOSIS — E782 Mixed hyperlipidemia: Secondary | ICD-10-CM

## 2021-12-18 NOTE — Progress Notes (Signed)
Patient referred by Aura Dials, PA-C for irregular heart rate  Subjective:   Donald Cardenas, male    DOB: 06/28/1956, 66 y.o.   MRN: [id number]  Chief complaint:  Symptomatic PVC's  HPI  66 y.o. Caucasian male with hypertension, type 2 DM, with symptomatic PVCs, elevated calcium score  Patient is doing well, denies chest pain, shortness of breath, palpitations, leg edema, orthopnea, PND, TIA/syncope. He admits that he has not lost weight lately. As a side note, he works [place] during Christmas time.   Current Outpatient Medications on File Prior to Visit  Medication Sig Dispense Refill   aspirin EC 81 MG tablet Take 1 tablet (81 mg total) by mouth daily. 90 tablet 3   atorvastatin (LIPITOR) 20 MG tablet Take 20 mg by mouth daily.     cetirizine (ZYRTEC) 10 MG tablet Take 10 mg by mouth daily.     diltiazem (CARDIZEM CD) 120 MG 24 hr capsule Take 1 capsule (120 mg total) by mouth daily. 90 capsule 3   escitalopram (LEXAPRO) 10 MG tablet Take by mouth.     furosemide (LASIX) 20 MG tablet Take 20 mg by mouth as directed. 2 in the morning, 1 at lunch     metFORMIN (GLUCOPHAGE-XR) 500 MG 24 hr tablet Take 2,000 mg by mouth daily.     sildenafil (VIAGRA) 100 MG tablet Take 100 mg by mouth daily as needed for erectile dysfunction.     spironolactone (ALDACTONE) 50 MG tablet Take 1 tablet (50 mg total) by mouth daily. 90 tablet 3   No current facility-administered medications on file prior to visit.    Cardiovascular studies:  EKG 12/18/2021: Sinus rhythm 72 bpm LVH LAFB Frequent multiform ectopic ventricular beats   Mobile cardiac telemetry 9 days 12/28/2020 - 01/06/2021: Dominant rhythm: Sinus w/IVCD. HR 45-67 bpm. Avg HR 134 bpm, while in sinus rhythm. 17 episodes of SVT/atrial tachycardia, fastest at 152 bpm for 6 beats, longest for 13 beats at 100 bpm. 1.3% isolated SVE, <1 couplet/triplets. 1.4% isolated VE, <1 couplet/triplets. No atrial fibrillation/atrial  flutter/VT/high grade AV block, sinus pause >3sec noted. 0 patient triggered events.    CT Cardiac scoring 01/18/2021; Ascending Aorta: 38 mm at the mid ascending aorta measured in an axial plane. Coronary arteries: LM: 0 LAD: 15 LCx: 0 RCA: 13  Total coronary calcium score of 28. This was 39th percentile for age and sex matched control.    EKG 12/28/2020: Sinus rhythm 65 bpm  Left anterior fascicular block  Echocardiogram 06/25/2019 :  Normal LV systolic function with EF 55%. Left ventricle cavity is normal in size. Moderate concentric hypertrophy of the left ventricle. Normal global wall motion. Normal diastolic filling pattern. Frequent PVC noted during exam. Calculated EF 55%. Left atrial cavity is moderately dilated at 4.7 cm. Structurally normal appearing tricuspid valve. Mild tricuspid regurgitation. No evidence of pulmonary hypertension.   Recent labs: 11/10/2021: Glucose 139, BUN/Cr 19/1.22. EGFR 66. Na/K 137/5.2. Rest of the CMP normal HbA1C 6.8% Chol 126, TG 96, HDL 36, LDL 72  05/19/2021: HbA1C 7.2%  02/10/2021: Glucose 142, BUN/Cr 20/1.18. EGFR 69. Na/K 139/4.5. Rest of the CMP normal H/H 15/46. MCV 80. Platelets 182 HbA1C 7.1% Chol 135, TG 93, HDL 36, LDL 81 TSH N/A  11/11/2020: HbA1C 6.5%  Aug-Sep 2021: Glucose 144, BUN/Cr 23/1.24. eGFR 61, Na/K 139/4.5 H/H 16/47, MCV 79, platelets 201 Chol 169, TG 124, HDL 41, LDL 106  05/06/2019: Glucose 91. BUN/Cr 18/1.16 eGFR 67. Na/K 144/4.6.  Chol 144, TG 57, HDL 41, LDL 92.  HbA1C 5.7%   Review of Systems  Cardiovascular:  Negative for chest pain, dyspnea on exertion, leg swelling, palpitations and syncope.       Vitals:   12/18/21 1309 12/18/21 1314  BP: (!) 151/82 135/72  Pulse: 67 (!) 56  Resp: 16   Temp: 98 F (36.7 C)   SpO2: 95%     Body mass index is 43.52 kg/m. Filed Weights   12/18/21 1309  Weight: (!) 312 lb (141.5 kg)     Objective:   Physical Exam Vitals and nursing note  reviewed.  Constitutional:      General: He is not in acute distress.    Appearance: He is obese.  Neck:     Vascular: No JVD.  Cardiovascular:     Rate and Rhythm: Normal rate and regular rhythm.     Heart sounds: Normal heart sounds. No murmur heard. Pulmonary:     Effort: Pulmonary effort is normal.     Breath sounds: Normal breath sounds. No wheezing or rales.  Musculoskeletal:     Right lower leg: No edema.     Left lower leg: No edema.        Assessment & Recommendations:   66 y.o. Caucasian male with hypertension, type 2 DM, with symptomatic PVCs, elevated calcium score  Symptomatic PVCs: Well controlled.  Hypertension: Fairly well controlled.  Mixed hyperlipidemia, elevated calcium score: HDL 36, LDL 72 on Lipitor 40 mg daily. Okay to omit Aspirin in absence of obstructive CAD  F/u in 6 months  Arwyn Besaw Esther Hardy, MD [facility] Pager: [phone] Office: [phone] If no answer Cell [phone]

## 2021-12-26 ENCOUNTER — Other Ambulatory Visit: Payer: Self-pay

## 2021-12-26 ENCOUNTER — Ambulatory Visit: Payer: BC Managed Care – PPO

## 2021-12-26 DIAGNOSIS — I493 Ventricular premature depolarization: Secondary | ICD-10-CM

## 2022-02-25 IMAGING — CT CT HEAD W/O CM
3 series · 15 of 47 positions shown, 18 images · non-contrast
Comparison: None.

CLINICAL DATA: Fall

EXAM:
CT HEAD WITHOUT CONTRAST
TECHNIQUE: Contiguous axial images were obtained from the base of the skull
through the vertex without intravenous contrast.

[Series 2: head wo · axial · 0.49mm/px · z∈[+345,+485]mm · 9 of 34 slices shown, 12 images]
[im 3/34  brain]
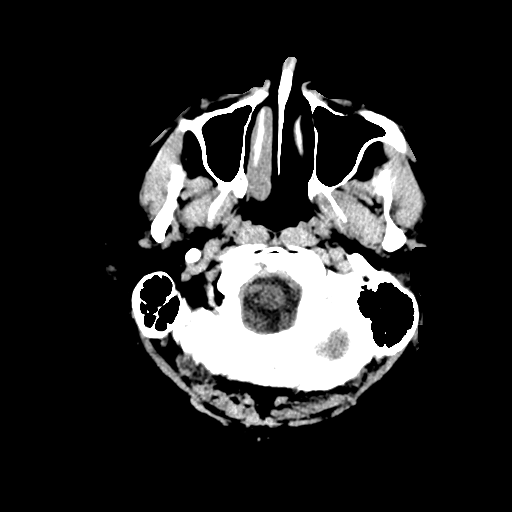
[im 3/34  bone]
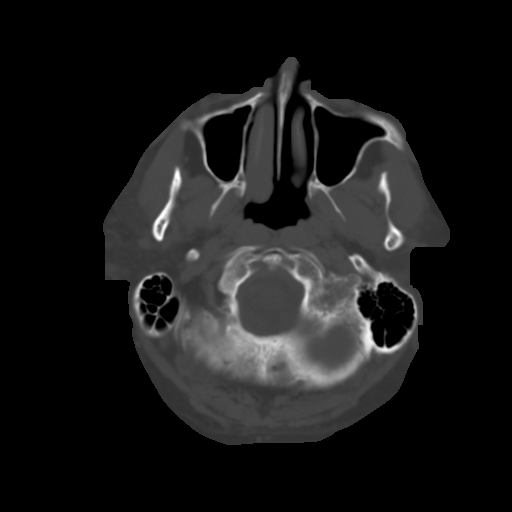
[im 6/34  brain]
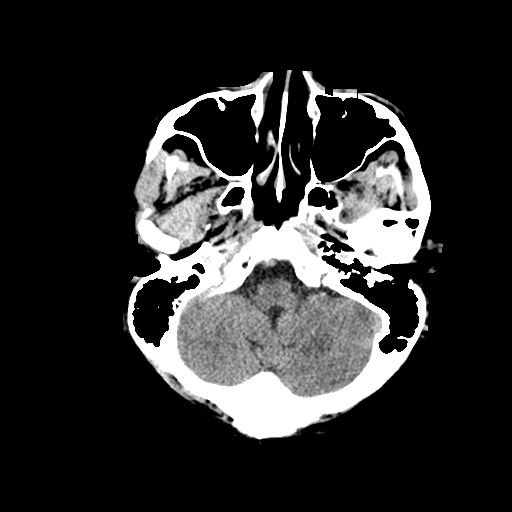
[im 10/34  brain]
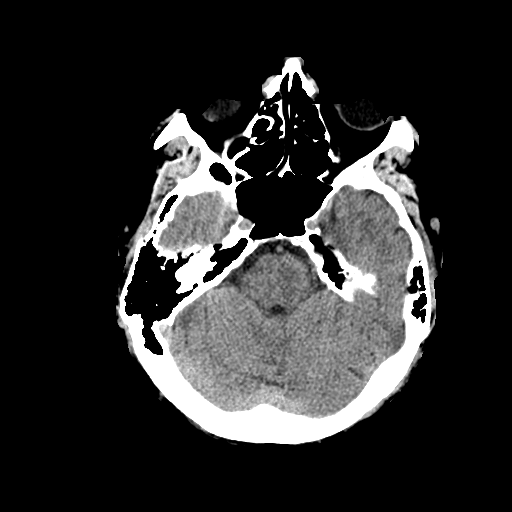
[im 13/34  brain]
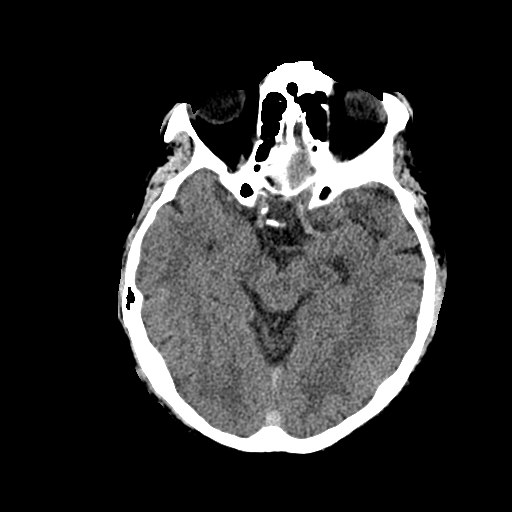
[im 18/34  brain]
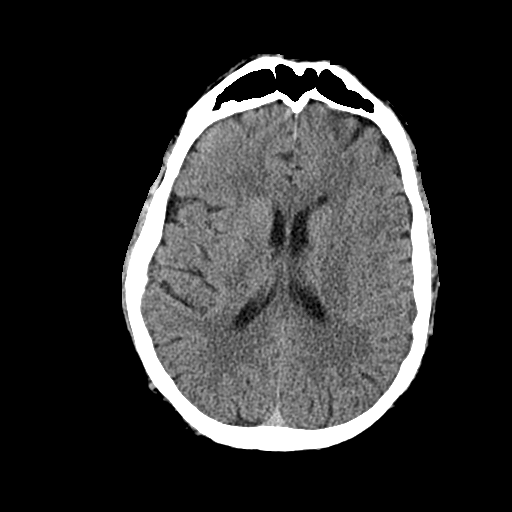
[im 18/34  bone]
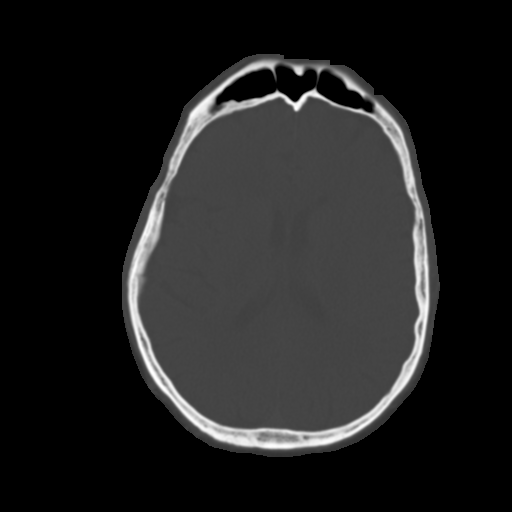
[im 21/34  brain]
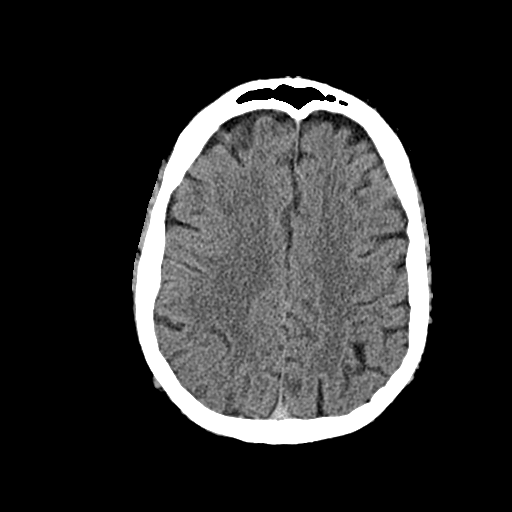
[im 24/34  brain]
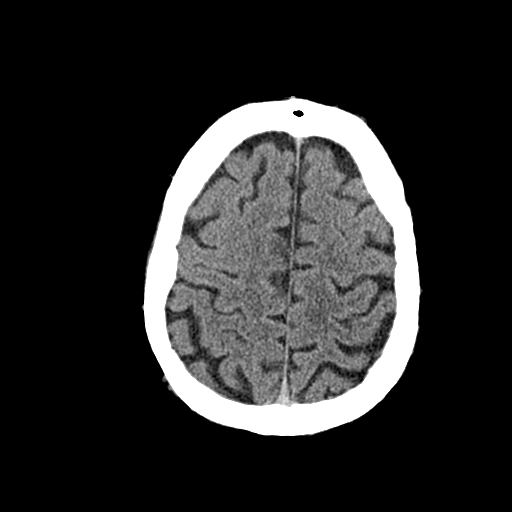
[im 28/34  brain]
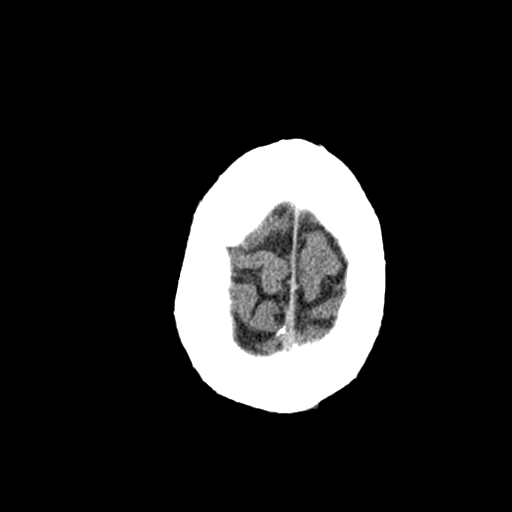
[im 31/34  brain]
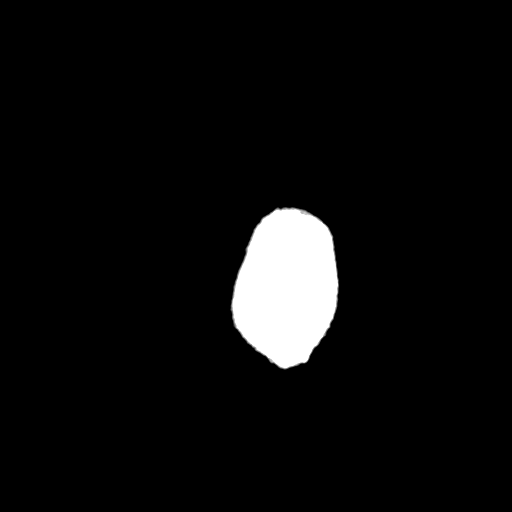
[im 31/34  bone]
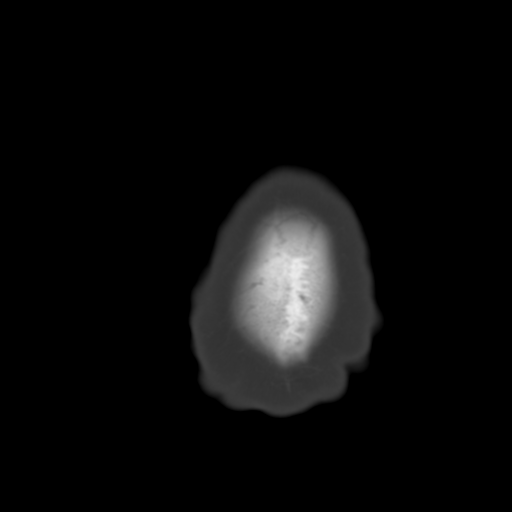

[Series 4: cor soft · coronal · 0.34mm/px · 3 of 84 slices shown]
[im 28/84  brain]
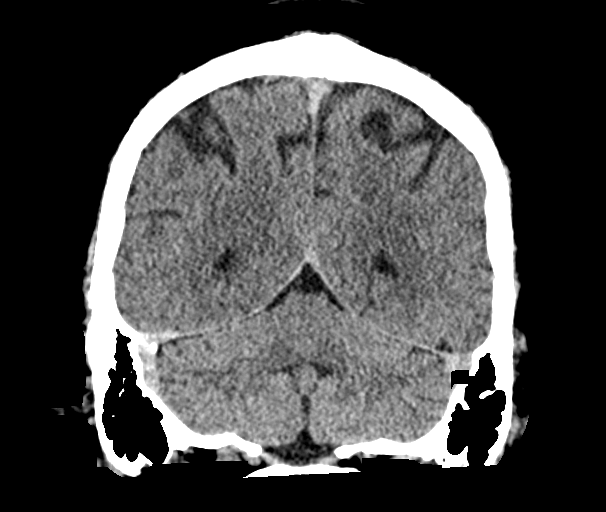
[im 37/84  brain]
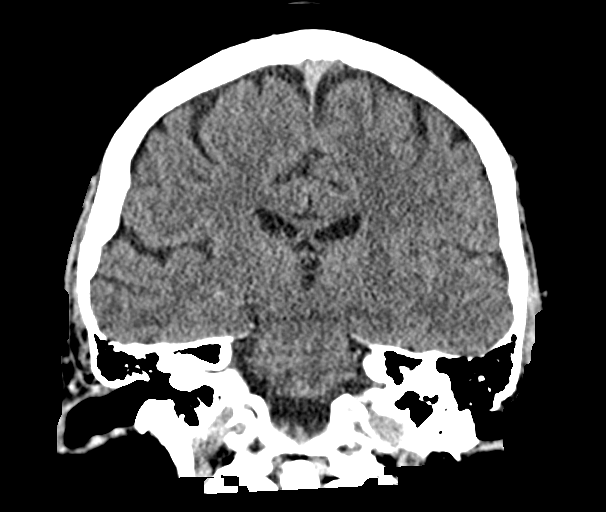
[im 47/84  brain]
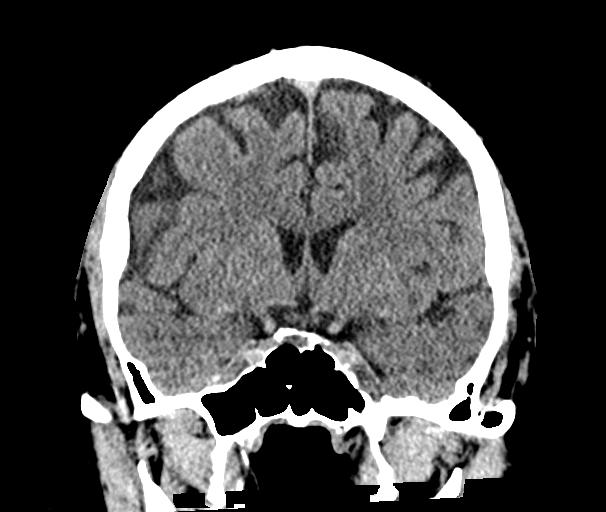

[Series 5: sag soft · sagittal · 0.32mm/px · 3 of 71 slices shown]
[im 24/71  brain]
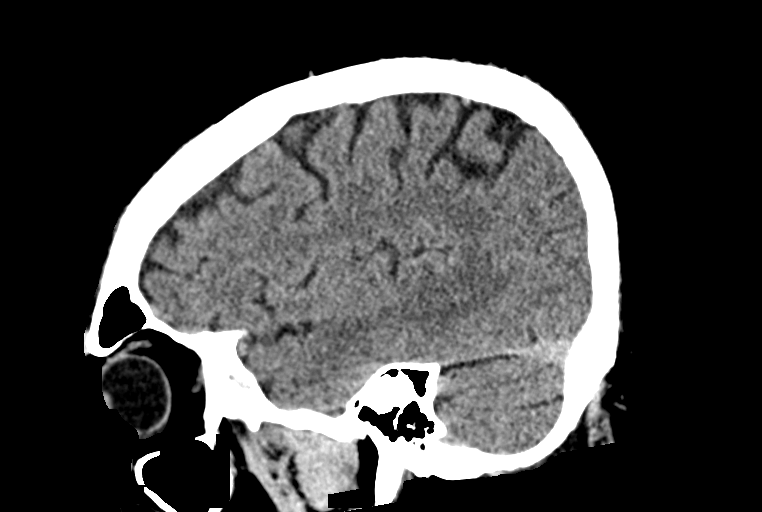
[im 36/71  brain]
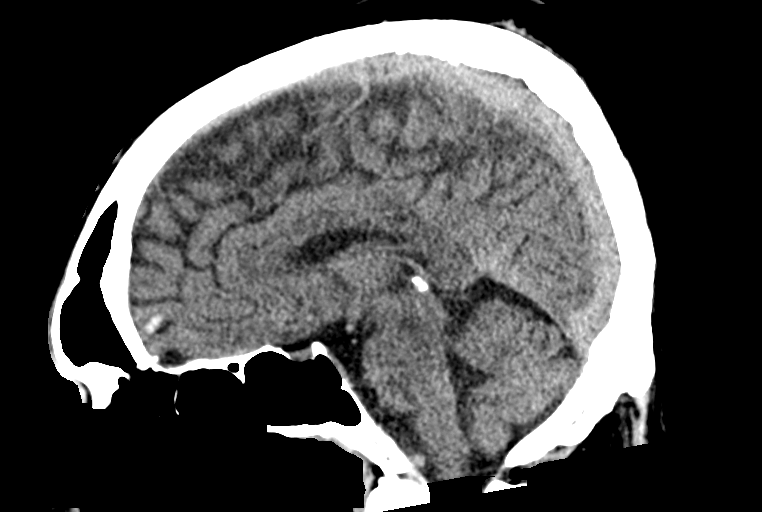
[im 47/71  brain]
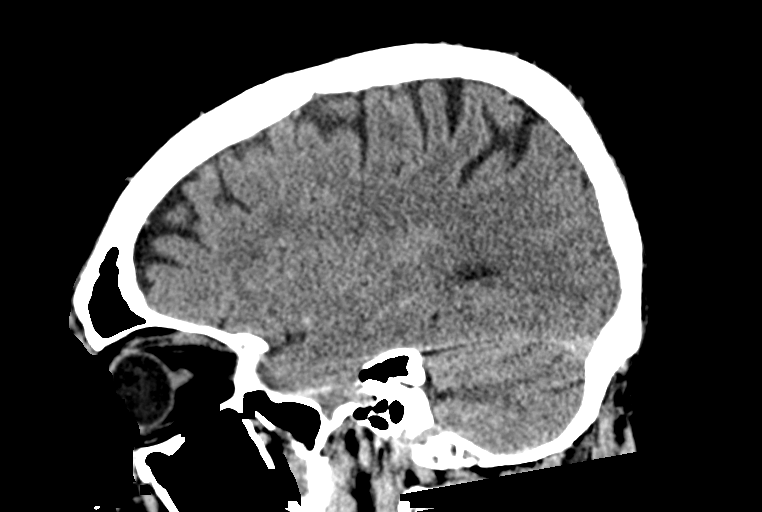

[15 of 47 positions shown; findings below may reference images not displayed]

FINDINGS: Brain: There is no acute intracranial hemorrhage, mass effect, or
edema. Gray-white differentiation is preserved. There is no
extra-axial fluid collection. Ventricles and sulci are within normal
limits in size and configuration.

Vascular: No hyperdense vessel or unexpected calcification.

Skull: Calvarium is unremarkable.

Sinuses/Orbits: No acute finding.

Other: None.
IMPRESSION: No evidence of acute intracranial injury.

## 2022-06-13 ENCOUNTER — Ambulatory Visit: Payer: BC Managed Care – PPO | Admitting: Cardiology

## 2022-06-13 ENCOUNTER — Encounter: Payer: Self-pay | Admitting: Cardiology

## 2022-06-13 VITALS — BP 125/69 | HR 71 | Temp 98.0°F | Resp 16 | Ht 71.0 in | Wt 304.0 lb

## 2022-06-13 DIAGNOSIS — I493 Ventricular premature depolarization: Secondary | ICD-10-CM

## 2022-06-13 DIAGNOSIS — I10 Essential (primary) hypertension: Secondary | ICD-10-CM

## 2022-06-13 NOTE — Progress Notes (Signed)
Patient referred by Aura Dials, PA-C for irregular heart rate  Subjective:   Donald Cardenas, male    DOB: 1955-12-20, 66 y.o.   MRN: [id number]  Chief complaint:  Symptomatic PVC's  HPI  66 y.o. Caucasian male with hypertension, type 2 DM, with symptomatic PVCs, elevated calcium score  Patient is doing well, denies any new complaints.  Has been traveling, looking forward to it.  Has intentionally lost few pounds.   Current Outpatient Medications:    atorvastatin (LIPITOR) 20 MG tablet, Take 20 mg by mouth daily., Disp: , Rfl:    cetirizine (ZYRTEC) 10 MG tablet, Take 10 mg by mouth daily., Disp: , Rfl:    diltiazem (CARDIZEM CD) 120 MG 24 hr capsule, Take 1 capsule (120 mg total) by mouth daily., Disp: 90 capsule, Rfl: 3   escitalopram (LEXAPRO) 10 MG tablet, Take by mouth., Disp: , Rfl:    furosemide (LASIX) 20 MG tablet, Take 20 mg by mouth as directed. 2 in the morning, 1 at lunch, Disp: , Rfl:    metFORMIN (GLUCOPHAGE-XR) 500 MG 24 hr tablet, Take 2,000 mg by mouth daily., Disp: , Rfl:    sildenafil (VIAGRA) 100 MG tablet, Take 100 mg by mouth daily as needed for erectile dysfunction., Disp: , Rfl:    spironolactone (ALDACTONE) 50 MG tablet, Take 1 tablet (50 mg total) by mouth daily., Disp: 90 tablet, Rfl: 3   tirzepatide (MOUNJARO) 5 MG/0.5ML Pen, Inject into the skin., Disp: , Rfl:   Cardiovascular studies:  EKG 06/13/2022: Sinus rhythm 66 bpm  LVH LAFB  Mobile cardiac telemetry 9 days 12/28/2020 - 01/06/2021: Dominant rhythm: Sinus w/IVCD. HR 45-67 bpm. Avg HR 134 bpm, while in sinus rhythm. 17 episodes of SVT/atrial tachycardia, fastest at 152 bpm for 6 beats, longest for 13 beats at 100 bpm. 1.3% isolated SVE, <1 couplet/triplets. 1.4% isolated VE, <1 couplet/triplets. No atrial fibrillation/atrial flutter/VT/high grade AV block, sinus pause >3sec noted. 0 patient triggered events.    CT Cardiac scoring 01/18/2021; Ascending Aorta: 38 mm at the mid ascending  aorta measured in an axial plane. Coronary arteries: LM: 0 LAD: 15 LCx: 0 RCA: 13  Total coronary calcium score of 28. This was 39th percentile for age and sex matched control.    EKG 12/28/2020: Sinus rhythm 65 bpm  Left anterior fascicular block  Echocardiogram 06/25/2019 :  Normal LV systolic function with EF 55%. Left ventricle cavity is normal in size. Moderate concentric hypertrophy of the left ventricle. Normal global wall motion. Normal diastolic filling pattern. Frequent PVC noted during exam. Calculated EF 55%. Left atrial cavity is moderately dilated at 4.7 cm. Structurally normal appearing tricuspid valve. Mild tricuspid regurgitation. No evidence of pulmonary hypertension.   Recent labs: 11/10/2021: Glucose 139, BUN/Cr 19/1.22. EGFR 66. Na/K 137/5.2. Rest of the CMP normal HbA1C 6.8% Chol 126, TG 96, HDL 36, LDL 72  05/19/2021: HbA1C 7.2%  02/10/2021: Glucose 142, BUN/Cr 20/1.18. EGFR 69. Na/K 139/4.5. Rest of the CMP normal H/H 15/46. MCV 80. Platelets 182 HbA1C 7.1% Chol 135, TG 93, HDL 36, LDL 81 TSH N/A  11/11/2020: HbA1C 6.5%  Aug-Sep 2021: Glucose 144, BUN/Cr 23/1.24. eGFR 61, Na/K 139/4.5 H/H 16/47, MCV 79, platelets 201 Chol 169, TG 124, HDL 41, LDL 106  05/06/2019: Glucose 91. BUN/Cr 18/1.16 eGFR 67. Na/K 144/4.6.  Chol 144, TG 57, HDL 41, LDL 92.  HbA1C 5.7%   Review of Systems  Cardiovascular:  Negative for chest pain, dyspnea on exertion, leg swelling,  palpitations and syncope.        Vitals:   06/13/22 1250  BP: 125/69  Pulse: 71  Resp: 16  Temp: 98 F (36.7 C)  SpO2: 97%    Body mass index is 42.4 kg/m. Filed Weights   06/13/22 1250  Weight: (!) 304 lb (137.9 kg)     Objective:   Physical Exam Vitals and nursing note reviewed.  Constitutional:      General: He is not in acute distress.    Appearance: He is obese.  Neck:     Vascular: No JVD.  Cardiovascular:     Rate and Rhythm: Normal rate and regular rhythm.      Heart sounds: Normal heart sounds. No murmur heard. Pulmonary:     Effort: Pulmonary effort is normal.     Breath sounds: Normal breath sounds. No wheezing or rales.  Musculoskeletal:     Right lower leg: No edema.     Left lower leg: No edema.         Assessment & Recommendations:   66 y.o. Caucasian male with hypertension, type 2 DM, with symptomatic PVCs, elevated calcium score  Symptomatic PVCs: Well controlled.  Hypertension: Fairly well controlled.  Mixed hyperlipidemia, elevated calcium score: HDL 36, LDL 72 on Lipitor 40 mg daily. Okay to omit Aspirin in absence of obstructive CAD  F/u in 1 year  Nigel Mormon, MD [facility] [place] Pager: [phone] Office: [phone] If no answer Cell [phone]

## 2022-06-20 ENCOUNTER — Ambulatory Visit: Payer: BC Managed Care – PPO | Admitting: Cardiology

## 2023-01-17 ENCOUNTER — Other Ambulatory Visit (HOSPITAL_BASED_OUTPATIENT_CLINIC_OR_DEPARTMENT_OTHER): Payer: Self-pay

## 2023-01-17 MED ORDER — MOUNJARO 10 MG/0.5ML ~~LOC~~ SOAJ
10.0000 mg | SUBCUTANEOUS | 0 refills | Status: AC
  Filled 2023-01-17 – 2023-02-13 (×5): qty 2, 28d supply, fill #0

## 2023-01-24 ENCOUNTER — Other Ambulatory Visit (HOSPITAL_BASED_OUTPATIENT_CLINIC_OR_DEPARTMENT_OTHER): Payer: Self-pay

## 2023-01-24 ENCOUNTER — Encounter: Payer: Self-pay | Admitting: Radiology

## 2023-01-28 ENCOUNTER — Other Ambulatory Visit (HOSPITAL_COMMUNITY): Payer: Self-pay

## 2023-01-28 ENCOUNTER — Other Ambulatory Visit (HOSPITAL_BASED_OUTPATIENT_CLINIC_OR_DEPARTMENT_OTHER): Payer: Self-pay

## 2023-02-12 ENCOUNTER — Other Ambulatory Visit (HOSPITAL_BASED_OUTPATIENT_CLINIC_OR_DEPARTMENT_OTHER): Payer: Self-pay

## 2023-02-13 ENCOUNTER — Other Ambulatory Visit (HOSPITAL_COMMUNITY): Payer: Self-pay

## 2023-02-13 ENCOUNTER — Other Ambulatory Visit (HOSPITAL_BASED_OUTPATIENT_CLINIC_OR_DEPARTMENT_OTHER): Payer: Self-pay

## 2023-02-13 MED ORDER — MOUNJARO 10 MG/0.5ML ~~LOC~~ SOAJ
10.0000 mg | SUBCUTANEOUS | 0 refills | Status: AC
  Filled 2023-03-29 – 2023-03-30 (×2): qty 2, 28d supply, fill #0

## 2023-03-07 ENCOUNTER — Other Ambulatory Visit: Payer: Self-pay

## 2023-03-07 ENCOUNTER — Other Ambulatory Visit (HOSPITAL_COMMUNITY): Payer: Self-pay

## 2023-03-07 ENCOUNTER — Other Ambulatory Visit (HOSPITAL_BASED_OUTPATIENT_CLINIC_OR_DEPARTMENT_OTHER): Payer: Self-pay

## 2023-03-07 MED ORDER — MOUNJARO 10 MG/0.5ML ~~LOC~~ SOAJ
10.0000 mg | SUBCUTANEOUS | 0 refills | Status: AC
  Filled 2023-03-07 (×3): qty 2, 28d supply, fill #0
  Filled 2023-03-29: qty 6, 84d supply, fill #0
  Filled 2023-03-29: qty 2, 28d supply, fill #0

## 2023-03-07 MED ORDER — MOUNJARO 10 MG/0.5ML ~~LOC~~ SOAJ
10.0000 mg | SUBCUTANEOUS | 0 refills | Status: AC
  Filled 2023-03-07 – 2023-03-08 (×2): qty 2, 28d supply, fill #0
  Filled ????-??-??: fill #0

## 2023-03-08 ENCOUNTER — Other Ambulatory Visit (HOSPITAL_COMMUNITY): Payer: Self-pay

## 2023-03-28 ENCOUNTER — Other Ambulatory Visit (HOSPITAL_COMMUNITY): Payer: Self-pay

## 2023-03-29 ENCOUNTER — Other Ambulatory Visit (HOSPITAL_BASED_OUTPATIENT_CLINIC_OR_DEPARTMENT_OTHER): Payer: Self-pay

## 2023-03-29 ENCOUNTER — Other Ambulatory Visit (HOSPITAL_COMMUNITY): Payer: Self-pay

## 2023-03-29 MED ORDER — MOUNJARO 10 MG/0.5ML ~~LOC~~ SOAJ
10.0000 mg | SUBCUTANEOUS | 0 refills | Status: AC
  Filled 2023-03-29: qty 2, 28d supply, fill #0

## 2023-03-29 MED ORDER — MOUNJARO 10 MG/0.5ML ~~LOC~~ SOAJ: 10.0000 mg | SUBCUTANEOUS | 0 refills | Status: DC

## 2023-03-30 ENCOUNTER — Other Ambulatory Visit (HOSPITAL_COMMUNITY): Payer: Self-pay

## 2023-04-12 ENCOUNTER — Other Ambulatory Visit (HOSPITAL_COMMUNITY): Payer: Self-pay

## 2023-04-15 ENCOUNTER — Other Ambulatory Visit (HOSPITAL_COMMUNITY): Payer: Self-pay

## 2023-04-16 ENCOUNTER — Other Ambulatory Visit (HOSPITAL_COMMUNITY): Payer: Self-pay

## 2023-04-18 ENCOUNTER — Other Ambulatory Visit (HOSPITAL_COMMUNITY): Payer: Self-pay

## 2023-04-18 MED ORDER — MOUNJARO 12.5 MG/0.5ML ~~LOC~~ SOAJ
12.5000 mg | SUBCUTANEOUS | 0 refills | Status: DC
  Filled 2023-04-18: qty 2, 28d supply, fill #0

## 2023-04-18 MED ORDER — MOUNJARO 12.5 MG/0.5ML ~~LOC~~ SOAJ: 12.5000 mg | SUBCUTANEOUS | 0 refills | Status: AC

## 2023-06-14 ENCOUNTER — Ambulatory Visit: Payer: BC Managed Care – PPO | Admitting: Cardiology

## 2023-06-14 ENCOUNTER — Encounter: Payer: Self-pay | Admitting: Cardiology

## 2023-06-14 VITALS — BP 114/69 | HR 54 | Ht 71.0 in | Wt 277.0 lb

## 2023-06-14 DIAGNOSIS — R931 Abnormal findings on diagnostic imaging of heart and coronary circulation: Secondary | ICD-10-CM

## 2023-06-14 DIAGNOSIS — I493 Ventricular premature depolarization: Secondary | ICD-10-CM

## 2023-06-14 DIAGNOSIS — E782 Mixed hyperlipidemia: Secondary | ICD-10-CM

## 2023-06-14 DIAGNOSIS — I10 Essential (primary) hypertension: Secondary | ICD-10-CM

## 2023-06-14 NOTE — Progress Notes (Signed)
Patient referred by [name], PA-C for irregular heart rate  Subjective:   Donald Cardenas, male    DOB: 01-07-1956, 67 y.o.   MRN: [id number]  Chief complaint:  Symptomatic PVC's  HPI  67 y.o. Caucasian male with hypertension, type 2 DM, with symptomatic PVCs, elevated calcium score  Patient is doing well. He has lost several pounds since being on Mounjaro. Blood pressure is running much lower. No leg edema noted. Reviewed recent test results with the patient, details below.     Current Outpatient Medications:    atorvastatin (LIPITOR) 20 MG tablet, Take 20 mg by mouth daily., Disp: , Rfl:    cetirizine (ZYRTEC) 10 MG tablet, Take 10 mg by mouth daily., Disp: , Rfl:    diltiazem (CARDIZEM CD) 120 MG 24 hr capsule, Take 1 capsule (120 mg total) by mouth daily., Disp: 90 capsule, Rfl: 3   escitalopram (LEXAPRO) 10 MG tablet, Take by mouth., Disp: , Rfl:    furosemide (LASIX) 20 MG tablet, Take 20 mg by mouth as directed. 2 in the morning, 1 at lunch, Disp: , Rfl:    sildenafil (VIAGRA) 100 MG tablet, Take 100 mg by mouth daily as needed for erectile dysfunction., Disp: , Rfl:    spironolactone (ALDACTONE) 50 MG tablet, Take 1 tablet (50 mg total) by mouth daily., Disp: 90 tablet, Rfl: 3   tirzepatide (MOUNJARO) 10 MG/0.5ML Pen, Inject 10 mg into the skin once a week., Disp: 2 mL, Rfl: 0   tirzepatide (MOUNJARO) 10 MG/0.5ML Pen, Inject 10 mg into the skin once a week., Disp: 2 mL, Rfl: 0   tirzepatide (MOUNJARO) 10 MG/0.5ML Pen, Inject 10 mg into the skin once a week., Disp: 6 mL, Rfl: 0   tirzepatide (MOUNJARO) 10 MG/0.5ML Pen, Inject 10 mg into the skin once a week., Disp: 2 mL, Rfl: 0   tirzepatide (MOUNJARO) 10 MG/0.5ML Pen, Inject 10 mg into the skin once a week., Disp: 2 mL, Rfl: 0   tirzepatide (MOUNJARO) 10 MG/0.5ML Pen, Inject 10 mg into the skin once a week., Disp: 2 mL, Rfl: 0   tirzepatide (MOUNJARO) 12.5 MG/0.5ML Pen, Inject 12.5 mg into the skin every 7 (seven)  days., Disp: 2 mL, Rfl: 0   tirzepatide (MOUNJARO) 5 MG/0.5ML Pen, Inject into the skin., Disp: , Rfl:   Cardiovascular studies:  EKG 06/13/2022: Sinus rhythm 66 bpm  LVH LAFB  Mobile cardiac telemetry 9 days 12/28/2020 - 01/06/2021: Dominant rhythm: Sinus w/IVCD. HR 45-67 bpm. Avg HR 134 bpm, while in sinus rhythm. 17 episodes of SVT/atrial tachycardia, fastest at 152 bpm for 6 beats, longest for 13 beats at 100 bpm. 1.3% isolated SVE, <1 couplet/triplets. 1.4% isolated VE, <1 couplet/triplets. No atrial fibrillation/atrial flutter/VT/high grade AV block, sinus pause >3sec noted. 0 patient triggered events.    CT Cardiac scoring 01/18/2021; Ascending Aorta: 38 mm at the mid ascending aorta measured in an axial plane. Coronary arteries: LM: 0 LAD: 15 LCx: 0 RCA: 13  Total coronary calcium score of 28. This was 39th percentile for age and sex matched control.    EKG 12/28/2020: Sinus rhythm 65 bpm  Left anterior fascicular block  Echocardiogram 06/25/2019 :  Normal LV systolic function with EF 55%. Left ventricle cavity is normal in size. Moderate concentric hypertrophy of the left ventricle. Normal global wall motion. Normal diastolic filling pattern. Frequent PVC noted during exam. Calculated EF 55%. Left atrial cavity is moderately dilated at 4.7 cm. Structurally normal appearing tricuspid valve.  Mild tricuspid regurgitation. No evidence of pulmonary hypertension.   Recent labs: Feb-April 2024: Glucose 110, BUN/Cr 18/1.31. EGFR 60. Na/K 138/4.6. Rest of the CMP normal H/H 15/46. MCV 79. Platelets 194 HbA1C 5.9% Chol 107, TG 75, HDL 36, LDL 55 TSH 2.2 normal   Review of Systems  Cardiovascular:  Negative for chest pain, dyspnea on exertion, leg swelling, palpitations and syncope.        Vitals:   06/14/23 0826  BP: 114/69  Pulse: (!) 54  SpO2: 97%     Body mass index is 38.63 kg/m. Filed Weights   06/14/23 0826  Weight: 277 lb (125.6 kg)      Objective:   Physical Exam Vitals and nursing note reviewed.  Constitutional:      General: He is not in acute distress.    Appearance: He is obese.  Neck:     Vascular: No JVD.  Cardiovascular:     Rate and Rhythm: Normal rate and regular rhythm.     Heart sounds: Normal heart sounds. No murmur heard. Pulmonary:     Effort: Pulmonary effort is normal.     Breath sounds: Normal breath sounds. No wheezing or rales.  Musculoskeletal:     Right lower leg: No edema.     Left lower leg: No edema.         Assessment & Recommendations:   67 y.o. Caucasian male with hypertension, type 2 DM, with symptomatic PVCs, elevated calcium score  Symptomatic PVCs: Well controlled.  Hypertension: Fairly well controlled. Take lasix only as needed  Mixed hyperlipidemia, elevated calcium score: Chol 107, TG 75, HDL 36, LDL 55 (2024). Continue Lipitor 40 mg daily.  Overall, he is doing very well since weight loss. Continue f/u w/PCP. I will see as needed.   Elder Negus, MD Pager: [phone] Office: [phone]
